# Patient Record
Sex: Female | Born: 1935 | Race: White | Hispanic: No | Marital: Married | Smoking: Former smoker
Health system: Southern US, Community
[De-identification: ages and names within clinical notes are randomized; demographics above are authoritative.]

## PROBLEM LIST (undated history)

## (undated) DIAGNOSIS — F419 Anxiety disorder, unspecified: Secondary | ICD-10-CM

## (undated) DIAGNOSIS — E039 Hypothyroidism, unspecified: Secondary | ICD-10-CM

## (undated) DIAGNOSIS — N3020 Other chronic cystitis without hematuria: Secondary | ICD-10-CM

## (undated) DIAGNOSIS — N189 Chronic kidney disease, unspecified: Secondary | ICD-10-CM

## (undated) DIAGNOSIS — M1990 Unspecified osteoarthritis, unspecified site: Secondary | ICD-10-CM

## (undated) DIAGNOSIS — D509 Iron deficiency anemia, unspecified: Secondary | ICD-10-CM

## (undated) DIAGNOSIS — M4800 Spinal stenosis, site unspecified: Secondary | ICD-10-CM

## (undated) DIAGNOSIS — I10 Essential (primary) hypertension: Secondary | ICD-10-CM

## (undated) DIAGNOSIS — K219 Gastro-esophageal reflux disease without esophagitis: Secondary | ICD-10-CM

## (undated) DIAGNOSIS — K529 Noninfective gastroenteritis and colitis, unspecified: Secondary | ICD-10-CM

## (undated) DIAGNOSIS — D519 Vitamin B12 deficiency anemia, unspecified: Secondary | ICD-10-CM

## (undated) DIAGNOSIS — D631 Anemia in chronic kidney disease: Secondary | ICD-10-CM

## (undated) DIAGNOSIS — R233 Spontaneous ecchymoses: Secondary | ICD-10-CM

## (undated) DIAGNOSIS — R238 Other skin changes: Secondary | ICD-10-CM

## (undated) DIAGNOSIS — M810 Age-related osteoporosis without current pathological fracture: Secondary | ICD-10-CM

## (undated) HISTORY — PX: CATARACT EXTRACTION W/ INTRAOCULAR LENS  IMPLANT, BILATERAL: SHX1307

## (undated) HISTORY — PX: APPENDECTOMY: SHX54

## (undated) HISTORY — PX: HAMMER TOE SURGERY: SHX385

## (undated) HISTORY — PX: VAGINAL HYSTERECTOMY: SUR661

## (undated) HISTORY — PX: BACK SURGERY: SHX140

---

## 1998-01-11 ENCOUNTER — Other Ambulatory Visit: Admission: RE | Admit: 1998-01-11 | Discharge: 1998-01-11 | Payer: Self-pay | Admitting: *Deleted

## 1998-09-12 ENCOUNTER — Other Ambulatory Visit: Admission: RE | Admit: 1998-09-12 | Discharge: 1998-09-12 | Payer: Self-pay | Admitting: Obstetrics and Gynecology

## 1998-10-29 ENCOUNTER — Ambulatory Visit (HOSPITAL_COMMUNITY): Admission: RE | Admit: 1998-10-29 | Discharge: 1998-10-29 | Payer: Self-pay | Admitting: Gastroenterology

## 2000-07-01 ENCOUNTER — Encounter: Admission: RE | Admit: 2000-07-01 | Discharge: 2000-07-01 | Payer: Self-pay | Admitting: Urology

## 2000-07-01 ENCOUNTER — Encounter: Payer: Self-pay | Admitting: Urology

## 2000-10-29 ENCOUNTER — Encounter: Payer: Self-pay | Admitting: Orthopedic Surgery

## 2000-10-29 ENCOUNTER — Ambulatory Visit (HOSPITAL_COMMUNITY): Admission: RE | Admit: 2000-10-29 | Discharge: 2000-10-29 | Payer: Self-pay | Admitting: Orthopedic Surgery

## 2000-11-19 ENCOUNTER — Encounter: Payer: Self-pay | Admitting: Orthopedic Surgery

## 2000-11-19 ENCOUNTER — Encounter: Admission: RE | Admit: 2000-11-19 | Discharge: 2000-11-19 | Payer: Self-pay | Admitting: Orthopedic Surgery

## 2000-12-03 ENCOUNTER — Encounter: Admission: RE | Admit: 2000-12-03 | Discharge: 2000-12-03 | Payer: Self-pay | Admitting: Orthopedic Surgery

## 2000-12-03 ENCOUNTER — Encounter: Payer: Self-pay | Admitting: Orthopedic Surgery

## 2000-12-16 ENCOUNTER — Encounter: Payer: Self-pay | Admitting: Orthopedic Surgery

## 2000-12-16 ENCOUNTER — Encounter: Admission: RE | Admit: 2000-12-16 | Discharge: 2000-12-16 | Payer: Self-pay | Admitting: Orthopedic Surgery

## 2003-06-18 ENCOUNTER — Encounter: Admission: RE | Admit: 2003-06-18 | Discharge: 2003-06-18 | Payer: Self-pay | Admitting: Orthopedic Surgery

## 2003-06-19 ENCOUNTER — Ambulatory Visit (HOSPITAL_COMMUNITY): Admission: RE | Admit: 2003-06-19 | Discharge: 2003-06-19 | Payer: Self-pay | Admitting: Orthopedic Surgery

## 2003-06-19 ENCOUNTER — Ambulatory Visit (HOSPITAL_BASED_OUTPATIENT_CLINIC_OR_DEPARTMENT_OTHER): Admission: RE | Admit: 2003-06-19 | Discharge: 2003-06-19 | Payer: Self-pay | Admitting: Orthopedic Surgery

## 2003-10-19 ENCOUNTER — Ambulatory Visit (HOSPITAL_COMMUNITY): Admission: RE | Admit: 2003-10-19 | Discharge: 2003-10-19 | Payer: Self-pay | Admitting: Orthopedic Surgery

## 2003-11-26 ENCOUNTER — Encounter: Admission: RE | Admit: 2003-11-26 | Discharge: 2003-11-26 | Payer: Self-pay | Admitting: Internal Medicine

## 2005-01-14 ENCOUNTER — Encounter: Admission: RE | Admit: 2005-01-14 | Discharge: 2005-01-14 | Payer: Self-pay | Admitting: Internal Medicine

## 2005-02-13 ENCOUNTER — Ambulatory Visit (HOSPITAL_COMMUNITY): Admission: RE | Admit: 2005-02-13 | Discharge: 2005-02-14 | Payer: Self-pay | Admitting: Neurosurgery

## 2005-06-22 HISTORY — PX: TOTAL KNEE ARTHROPLASTY: SHX125

## 2006-04-09 ENCOUNTER — Inpatient Hospital Stay (HOSPITAL_COMMUNITY): Admission: RE | Admit: 2006-04-09 | Discharge: 2006-04-14 | Payer: Self-pay | Admitting: Orthopedic Surgery

## 2006-05-05 ENCOUNTER — Encounter: Payer: Self-pay | Admitting: Orthopedic Surgery

## 2006-05-18 ENCOUNTER — Inpatient Hospital Stay: Payer: Self-pay | Admitting: Internal Medicine

## 2006-05-18 ENCOUNTER — Other Ambulatory Visit: Payer: Self-pay

## 2006-05-22 ENCOUNTER — Encounter: Payer: Self-pay | Admitting: Orthopedic Surgery

## 2006-06-22 ENCOUNTER — Encounter: Payer: Self-pay | Admitting: Orthopedic Surgery

## 2006-07-23 ENCOUNTER — Encounter: Payer: Self-pay | Admitting: Orthopedic Surgery

## 2007-02-08 ENCOUNTER — Emergency Department: Payer: Self-pay | Admitting: Emergency Medicine

## 2007-02-08 ENCOUNTER — Other Ambulatory Visit: Payer: Self-pay

## 2007-04-12 ENCOUNTER — Ambulatory Visit: Payer: Self-pay | Admitting: Gastroenterology

## 2008-06-22 HISTORY — PX: TOTAL HIP ARTHROPLASTY: SHX124

## 2008-08-20 ENCOUNTER — Ambulatory Visit: Payer: Self-pay | Admitting: Oncology

## 2008-08-21 ENCOUNTER — Ambulatory Visit: Payer: Self-pay | Admitting: Oncology

## 2008-09-20 ENCOUNTER — Ambulatory Visit: Payer: Self-pay | Admitting: Oncology

## 2008-11-07 ENCOUNTER — Ambulatory Visit: Payer: Self-pay | Admitting: Oncology

## 2008-11-17 ENCOUNTER — Emergency Department: Payer: Self-pay | Admitting: Unknown Physician Specialty

## 2008-11-20 ENCOUNTER — Ambulatory Visit: Payer: Self-pay | Admitting: Oncology

## 2008-12-20 ENCOUNTER — Ambulatory Visit: Payer: Self-pay | Admitting: Oncology

## 2009-01-29 ENCOUNTER — Ambulatory Visit: Payer: Self-pay | Admitting: Oncology

## 2009-02-20 ENCOUNTER — Ambulatory Visit: Payer: Self-pay | Admitting: Oncology

## 2009-03-22 ENCOUNTER — Ambulatory Visit: Payer: Self-pay | Admitting: Oncology

## 2009-04-22 ENCOUNTER — Ambulatory Visit: Payer: Self-pay | Admitting: Oncology

## 2009-04-23 ENCOUNTER — Ambulatory Visit: Payer: Self-pay | Admitting: Oncology

## 2009-05-22 ENCOUNTER — Ambulatory Visit: Payer: Self-pay | Admitting: Oncology

## 2009-06-22 ENCOUNTER — Ambulatory Visit: Payer: Self-pay | Admitting: Oncology

## 2009-07-23 ENCOUNTER — Ambulatory Visit: Payer: Self-pay | Admitting: Oncology

## 2009-07-30 ENCOUNTER — Ambulatory Visit: Payer: Self-pay | Admitting: Obstetrics and Gynecology

## 2009-08-13 ENCOUNTER — Ambulatory Visit: Payer: Self-pay | Admitting: Oncology

## 2009-08-20 ENCOUNTER — Ambulatory Visit: Payer: Self-pay | Admitting: Oncology

## 2009-09-20 ENCOUNTER — Ambulatory Visit: Payer: Self-pay | Admitting: Oncology

## 2009-09-24 ENCOUNTER — Ambulatory Visit: Payer: Self-pay | Admitting: Oncology

## 2009-10-20 ENCOUNTER — Ambulatory Visit: Payer: Self-pay | Admitting: Oncology

## 2009-11-20 ENCOUNTER — Ambulatory Visit: Payer: Self-pay | Admitting: Oncology

## 2009-12-03 ENCOUNTER — Ambulatory Visit: Payer: Self-pay | Admitting: Oncology

## 2009-12-20 ENCOUNTER — Ambulatory Visit: Payer: Self-pay | Admitting: Oncology

## 2010-01-20 ENCOUNTER — Ambulatory Visit: Payer: Self-pay | Admitting: Oncology

## 2010-02-20 ENCOUNTER — Ambulatory Visit: Payer: Self-pay | Admitting: Oncology

## 2010-03-22 ENCOUNTER — Ambulatory Visit: Payer: Self-pay | Admitting: Oncology

## 2010-04-22 ENCOUNTER — Ambulatory Visit: Payer: Self-pay | Admitting: Oncology

## 2010-05-27 ENCOUNTER — Ambulatory Visit: Payer: Self-pay | Admitting: Oncology

## 2010-06-22 ENCOUNTER — Ambulatory Visit: Payer: Self-pay | Admitting: Oncology

## 2010-07-23 ENCOUNTER — Ambulatory Visit: Payer: Self-pay | Admitting: Oncology

## 2010-08-21 ENCOUNTER — Ambulatory Visit: Payer: Self-pay | Admitting: Oncology

## 2010-09-21 ENCOUNTER — Ambulatory Visit: Payer: Self-pay | Admitting: Oncology

## 2010-10-06 ENCOUNTER — Emergency Department (HOSPITAL_COMMUNITY): Payer: Medicare Other

## 2010-10-06 ENCOUNTER — Emergency Department (HOSPITAL_COMMUNITY)
Admission: EM | Admit: 2010-10-06 | Discharge: 2010-10-06 | Disposition: A | Discharge status: Home / Self Care | Payer: Medicare Other | Attending: Emergency Medicine | Admitting: Emergency Medicine

## 2010-10-06 DIAGNOSIS — E039 Hypothyroidism, unspecified: Secondary | ICD-10-CM | POA: Insufficient documentation

## 2010-10-06 DIAGNOSIS — I10 Essential (primary) hypertension: Secondary | ICD-10-CM | POA: Insufficient documentation

## 2010-10-06 DIAGNOSIS — Z96649 Presence of unspecified artificial hip joint: Secondary | ICD-10-CM | POA: Insufficient documentation

## 2010-10-06 DIAGNOSIS — K219 Gastro-esophageal reflux disease without esophagitis: Secondary | ICD-10-CM | POA: Insufficient documentation

## 2010-10-06 DIAGNOSIS — M25559 Pain in unspecified hip: Secondary | ICD-10-CM | POA: Insufficient documentation

## 2010-10-08 ENCOUNTER — Emergency Department: Payer: Self-pay | Admitting: Emergency Medicine

## 2010-10-15 ENCOUNTER — Emergency Department (HOSPITAL_COMMUNITY): Payer: Medicare Other

## 2010-10-15 ENCOUNTER — Inpatient Hospital Stay: Admission: AD | Admit: 2010-10-15 | Payer: Self-pay | Source: Ambulatory Visit | Admitting: Neurosurgery

## 2010-10-15 ENCOUNTER — Inpatient Hospital Stay (HOSPITAL_COMMUNITY)
Admission: EM | Admit: 2010-10-15 | Discharge: 2010-10-18 | DRG: 491 | Disposition: A | Discharge status: Home / Self Care | Payer: Medicare Other | Attending: Neurosurgery | Admitting: Neurosurgery

## 2010-10-15 DIAGNOSIS — M216X9 Other acquired deformities of unspecified foot: Secondary | ICD-10-CM | POA: Diagnosis present

## 2010-10-15 DIAGNOSIS — Z96659 Presence of unspecified artificial knee joint: Secondary | ICD-10-CM

## 2010-10-15 DIAGNOSIS — N189 Chronic kidney disease, unspecified: Secondary | ICD-10-CM | POA: Diagnosis present

## 2010-10-15 DIAGNOSIS — M47817 Spondylosis without myelopathy or radiculopathy, lumbosacral region: Secondary | ICD-10-CM | POA: Diagnosis present

## 2010-10-15 DIAGNOSIS — F411 Generalized anxiety disorder: Secondary | ICD-10-CM | POA: Diagnosis present

## 2010-10-15 DIAGNOSIS — M5137 Other intervertebral disc degeneration, lumbosacral region: Secondary | ICD-10-CM | POA: Diagnosis present

## 2010-10-15 DIAGNOSIS — M51379 Other intervertebral disc degeneration, lumbosacral region without mention of lumbar back pain or lower extremity pain: Secondary | ICD-10-CM | POA: Diagnosis present

## 2010-10-15 DIAGNOSIS — E039 Hypothyroidism, unspecified: Secondary | ICD-10-CM | POA: Diagnosis present

## 2010-10-15 DIAGNOSIS — M5126 Other intervertebral disc displacement, lumbar region: Principal | ICD-10-CM | POA: Diagnosis present

## 2010-10-15 DIAGNOSIS — I129 Hypertensive chronic kidney disease with stage 1 through stage 4 chronic kidney disease, or unspecified chronic kidney disease: Secondary | ICD-10-CM | POA: Diagnosis present

## 2010-10-15 LAB — CBC
Hemoglobin: 12.5 g/dL (ref 12.0–15.0)
MCHC: 32.4 g/dL (ref 30.0–36.0)
RDW: 13.4 % (ref 11.5–15.5)

## 2010-10-15 LAB — BASIC METABOLIC PANEL
CO2: 25 mEq/L (ref 19–32)
Calcium: 9.1 mg/dL (ref 8.4–10.5)
Creatinine, Ser: 1.61 mg/dL — ABNORMAL HIGH (ref 0.4–1.2)
GFR calc Af Amer: 37 mL/min — ABNORMAL LOW (ref >60)

## 2010-10-15 LAB — DIFFERENTIAL
Basophils Absolute: 0 10*3/uL (ref 0.0–0.1)
Basophils Relative: 0 % (ref 0–1)
Monocytes Absolute: 0.9 10*3/uL (ref 0.1–1.0)
Neutro Abs: 5.6 10*3/uL (ref 1.7–7.7)

## 2010-10-15 LAB — PROTIME-INR
INR: 0.98 (ref 0.00–1.49)
Prothrombin Time: 13.2 seconds (ref 11.6–15.2)

## 2010-10-16 LAB — URINALYSIS, ROUTINE W REFLEX MICROSCOPIC
Bilirubin Urine: NEGATIVE
Glucose, UA: NEGATIVE mg/dL
Hgb urine dipstick: NEGATIVE
Ketones, ur: NEGATIVE mg/dL
pH: 5.5 (ref 5.0–8.0)

## 2010-10-16 LAB — C-REACTIVE PROTEIN: CRP: 0.1 mg/dL — ABNORMAL LOW (ref <0.6)

## 2010-10-17 ENCOUNTER — Inpatient Hospital Stay (HOSPITAL_COMMUNITY): Payer: Medicare Other

## 2010-10-17 LAB — ABO/RH: ABO/RH(D): O POS

## 2010-10-17 LAB — TYPE AND SCREEN: Antibody Screen: NEGATIVE

## 2010-10-22 NOTE — Op Note (Signed)
Victoria Rodgers            ACCOUNT NO.:  [id number]  MEDICAL RECORD NO.:  [id number]           PATIENT TYPE:  I  LOCATION:  3039                         FACILITY:  [facility]  PHYSICIAN:  Danae Orleans. Venetia Maxon, M.D.  DATE OF BIRTH:  [birth year]  DATE OF PROCEDURE:  10/17/2010 DATE OF DISCHARGE:                              OPERATIVE REPORT   PREOPERATIVE DIAGNOSES:  Left L5 radiculopathy, footdrop with spondylosis, herniated disk, degenerative disk disease at L4-L5 and L5- S1 levels.  POSTOPERATIVE DIAGNOSES:  Left L5 radiculopathy, footdrop with spondylosis, herniated disk, degenerative disk disease at L4-L5 and L5- S1 levels.  PROCEDURES: 1. Redo-decompressive lumbar laminectomy, L4-L5 left. 2. Left L5-S1 decompressive laminectomy with foraminotomy. 3. Microdissection.  SURGEON:  Danae Orleans. Venetia Maxon, MD  ASSISTANT:  Cristi Loron, MD  ANESTHESIA:  General endotracheal anesthesia.  ESTIMATED BLOOD LOSS:  Minimal.  COMPLICATIONS:  None.  DISPOSITION:  To recovery.  INDICATIONS:  Victoria Rodgers is an 77 year old woman with a left footdrop.  She had previous left L4-L5 microdiskectomy and has a foraminal and extraforaminal compression of the left L5 nerve root at the L5-S1 level with significant L5 nerve root compression at the L4-L5 level.  It was elected to perform decompression at both levels.  DESCRIPTION OF PROCEDURE:  Victoria Rodgers was brought to the operating room.  Following satisfactory and uncomplicated induction of general endotracheal anesthesia plus intravenous lines, the patient was placed in a prone position on the Wilson frame.  Soft tissue and bony prominences were padded appropriately.  Her low back was prepped and draped in usual sterile fashion.  Area of planned incision was infiltrated with local lidocaine.  Incision was then made in the midline, carried to the lumbodorsal fascia, it was incised sharply on the left side of midline.  Subperiosteal  dissection was performed exposing the L4-L5 and L5-S1 levels.  Intraoperative x-ray confirmed correct orientation.  Previous scar tissue was cleared from the L4-L5 level and a laminectomy of L5 was performed with a high-speed drill and Kerrison rongeurs to decompress the L5 nerve root from its takeoff up to the level of the L5 pedicle, also the S1 nerve root was decompressed. At the L4-L5 level, a redo-laminectomy was performed.  Careful dissection was made through scar tissue and the lateral thecal sac was exposed decompressed as was the L5 nerve root.  Significant scar tissue was removed with significant decompression of the thecal sac at L5 nerve root.  Under microscopic visualization, this decompression was completed and the L5 nerve root was decompressed widely as it extended out the neural foramen using angled Kerrison rongeurs.  It was felt that the angled curettes were used to remove additional ligamentous material that was causing significant compression of the L5 nerve root.  It was felt that the neural elements were well decompressed at this point. Hemostasis was assured.  Gelfoam soaked in thrombin.  The operative site was bathed in Depo-Medrol and fentanyl.  The self-retaining retractor was removed.  The lumbodorsal fascia was closed with 0 Vicryl sutures, subcutaneous tissues were reapproximated with 2-0 Vicryl interrupted inverted sutures, and skin edges were reapproximated with 3-0 Vicryl subcuticular stitch.  The wound was dressed with Dermabond.  The patient was extubated in the operating room and taken to the recovery in stable and satisfactory condition having tolerated the operation well.  Counts were correct at the end of the case.     Danae Orleans. Venetia Maxon, M.D.     JDS/MEDQ  D:  10/17/2010  T:  10/18/2010  Job:  [id number]  Electronically Signed by Maeola Harman M.D. on 10/22/2010 08:14:28 AM

## 2010-11-04 NOTE — Discharge Summary (Signed)
  [name], Victoria            ACCOUNT NO.:  [id number]  MEDICAL RECORD NO.:  [id number]           PATIENT TYPE:  I  LOCATION:  3039                         FACILITY:  [facility]  PHYSICIAN:  Danae Orleans. Venetia Maxon, M.D.  DATE OF BIRTH:  1928-03-02  DATE OF ADMISSION:  10/15/2010 DATE OF DISCHARGE:  10/18/2010                              DISCHARGE SUMMARY   REASON FOR ADMISSION:  Left leg pain which was intractable.  The patient presented to the office and was found to have left dorsiflexion weakness.  She had an MRI of her lumbar spine performed on an urgent basis which showed a left L5-S1 foraminal nerve root compression of the left L5 nerve root and L4-5 spondylosis and stenosis. The patient was started on Decadron with some improvement in her radicular pain.  However, she did not get complete relief of her pain and it was elected at that point to take her to surgery.  She was then taken to surgery on October 17, 2010, and underwent a left L5-S1 decompression with micro dissection and a left L4-5 redo decompression for recurrent spinal stenosis.  The patient did well with her surgery and postoperatively had relief of her leg pain and was then doing well on October 18, 2010, and was discharged home in stable and satisfactory condition having tolerated her operation and hospitalization well with relief of her leg pain and with improvement in her dorsiflexion weakness.  Her final diagnoses were same as admission.  DISCHARGE MEDICATIONS: 1. Completion of Medrol Dosepak. 2. Nexium 40 mg daily. 3. Benicar 40 mg daily. 4. Citalopram 20 mg daily. 5. Budesonide 3 mg orally 2 capsules twice daily. 6. Levothyroxine 88 mcg daily. 7. Percocet 5/325 one to two every 4 hours as needed for pain.  Instructions were for the patient to ambulate progressively today, to follow up with me in my office 3 weeks post discharge.     Danae Orleans. Venetia Maxon, M.D.     JDS/MEDQ  D:  10/28/2010  T:   10/28/2010  Job:  [id number]  Electronically Signed by Maeola Harman M.D. on 11/04/2010 07:43:59 AM

## 2010-11-20 ENCOUNTER — Ambulatory Visit: Payer: Self-pay | Admitting: Oncology

## 2010-11-21 ENCOUNTER — Ambulatory Visit: Payer: Self-pay | Admitting: Oncology

## 2011-01-22 ENCOUNTER — Ambulatory Visit: Payer: Self-pay | Admitting: Oncology

## 2011-02-21 ENCOUNTER — Ambulatory Visit: Payer: Self-pay | Admitting: Oncology

## 2011-04-23 ENCOUNTER — Ambulatory Visit: Payer: Self-pay | Admitting: Oncology

## 2011-05-23 ENCOUNTER — Ambulatory Visit: Payer: Self-pay | Admitting: Oncology

## 2011-07-09 ENCOUNTER — Ambulatory Visit: Payer: Self-pay | Admitting: Oncology

## 2011-07-09 LAB — CANCER CENTER HEMOGLOBIN: HGB: 10.1 g/dL — ABNORMAL LOW (ref 12.0–16.0)

## 2011-07-24 ENCOUNTER — Other Ambulatory Visit: Payer: Self-pay | Admitting: Neurosurgery

## 2011-07-24 ENCOUNTER — Ambulatory Visit: Payer: Self-pay | Admitting: Oncology

## 2011-07-24 DIAGNOSIS — M542 Cervicalgia: Secondary | ICD-10-CM

## 2011-07-31 ENCOUNTER — Ambulatory Visit
Admission: RE | Admit: 2011-07-31 | Discharge: 2011-07-31 | Disposition: A | Discharge status: Home / Self Care | Payer: Medicare Other | Source: Ambulatory Visit | Attending: Neurosurgery | Admitting: Neurosurgery

## 2011-07-31 DIAGNOSIS — M542 Cervicalgia: Secondary | ICD-10-CM

## 2011-08-13 ENCOUNTER — Encounter (HOSPITAL_COMMUNITY): Payer: Self-pay | Admitting: Pharmacy Technician

## 2011-08-13 ENCOUNTER — Other Ambulatory Visit: Payer: Self-pay | Admitting: Neurosurgery

## 2011-08-20 ENCOUNTER — Encounter (HOSPITAL_COMMUNITY)
Admission: RE | Admit: 2011-08-20 | Discharge: 2011-08-20 | Disposition: A | Discharge status: Home / Self Care | Payer: Medicare Other | Source: Ambulatory Visit | Attending: Neurosurgery | Admitting: Neurosurgery

## 2011-08-20 ENCOUNTER — Ambulatory Visit (HOSPITAL_COMMUNITY)
Admission: RE | Admit: 2011-08-20 | Discharge: 2011-08-20 | Disposition: A | Discharge status: Home / Self Care | Payer: Medicare Other | Source: Ambulatory Visit | Attending: Anesthesiology | Admitting: Anesthesiology

## 2011-08-20 ENCOUNTER — Encounter (HOSPITAL_COMMUNITY): Payer: Self-pay

## 2011-08-20 HISTORY — DX: Essential (primary) hypertension: I10

## 2011-08-20 HISTORY — DX: Spontaneous ecchymoses: R23.3

## 2011-08-20 HISTORY — DX: Other skin changes: R23.8

## 2011-08-20 HISTORY — DX: Hypothyroidism, unspecified: E03.9

## 2011-08-20 HISTORY — DX: Unspecified osteoarthritis, unspecified site: M19.90

## 2011-08-20 HISTORY — DX: Gastro-esophageal reflux disease without esophagitis: K21.9

## 2011-08-20 HISTORY — DX: Anxiety disorder, unspecified: F41.9

## 2011-08-20 LAB — BASIC METABOLIC PANEL
BUN: 34 mg/dL — ABNORMAL HIGH (ref 6–23)
Calcium: 9.6 mg/dL (ref 8.4–10.5)
GFR calc non Af Amer: 28 mL/min — ABNORMAL LOW (ref >90)
Glucose, Bld: 88 mg/dL (ref 70–99)
Sodium: 137 mEq/L (ref 135–145)

## 2011-08-20 LAB — SURGICAL PCR SCREEN: MRSA, PCR: NEGATIVE

## 2011-08-20 LAB — CBC
MCH: 31.3 pg (ref 26.0–34.0)
MCHC: 32.8 g/dL (ref 30.0–36.0)
Platelets: 206 10*3/uL (ref 150–400)
RBC: 3.2 MIL/uL — ABNORMAL LOW (ref 3.87–5.11)

## 2011-08-20 MED ORDER — CEFAZOLIN SODIUM-DEXTROSE 2-3 GM-% IV SOLR
2.0000 g | INTRAVENOUS | Status: AC
  Administered 2011-08-21: 2 g via INTRAVENOUS
  Filled 2011-08-20: qty 50

## 2011-08-20 NOTE — Progress Notes (Signed)
Pt doesn't have a cardiologist;medical MD Dr.James Burnett Sheng maintains HTN  Denies having an echo/stress test/heart cath

## 2011-08-20 NOTE — H&P (Signed)
Victoria Rodgers #[id number] DOB:  08/07/1927  08/12/2011:    Victoria Rodgers returns today to review her MRI of her cervical spine, which shows that at C4-5 she has disc degeneration and spondylosis, with bilateral facet degeneration and mild spinal stenosis.  At C5-6 she has disc degeneration and extensive spondylosis, prominent posterior osteophytes causing flattening of the cord with moderate spinal stenosis and moderate foraminal encroachment bilaterally. At C6-7 she has advanced spondylosis with large posterior osteophytes causing moderate spinal stenosis and cord deformity, with moderate to severe foraminal encroachment bilaterally.  There is 3 mm. of anterior slip of C7 on T1 without evidence of foraminal or nerve root compression.    Based on her examination and significance of pain, which persists as well as with significant persistent weakness, which to my review on today's examination she does have weakness principally involving her biceps, wrists and triceps.   I have advised her to proceed with surgical correction of her significant structural abnormalities including cervical kyphosis as well as cord compression, for cervical radiculopathy and myelopathy.  This would consist of anterior cervical decompression and fusion C4 through C7 levels.  She wishes to proceed with this and this has been set up for 08/21/2011.   I have recommended to the patient that she undergo anterior cervical discectomy and fusion with plating.  I went over the diagnostic studies in detail and reviewed surgical models and also discussed the exact nature of the surgical procedure, attendant risks, potential benefits and typical operative and postoperative course.  I discussed the risks of surgery which include, but are not limited to, risks of anesthesia, blood loss, infection, injury to various neck structures including the trachea, esophagus, which could cause temporary or permanent swallowing difficulties and also the  potential for perforation of the esophagus which might require operative intervention, larynx, recurrent laryngeal nerve, which could cause either temporary or permanent vocal cord paralysis resulting in either temporary or permanent voice changes, injury to cervical nerve roots, which could cause either temporary or permanent arm pain, numbness and/or weakness.  There is a small chance of injury to the spinal cord which could cause paralysis.  There is also the potential for malplacement of instrumentation, fusion failure, need for repeat surgery, degenerative disease at other levels in the neck, failure to relieve the pain, worsening of pain.  I also discussed with the patient that she will lose some neck mobility from the surgery.  It is typical to stay in the hospital overnight after this operation.  Typically she will not be able to drive for two weeks after surgery and will come back to see me two weeks following surgery with a lateral C-spine x-ray and then for monthly visits to three months after surgery.  Generally patients are out of work for four to six weeks following surgery.  She will wear a soft collar for two weeks after surgery.    She was fitted for a Vista cervical collar and is aware of the risks and benefits, and wishes to proceed.          Danae Orleans. Venetia Maxon, M.D./aft  cc: Dr. Jerl Mina    NEUROSURGICAL CONSULTATION  Victoria Rodgers DOB: September 14, 1927 #[id number] October 15, 2010    HISTORY: Victoria Rodgers is an 78 year old female who comes in for evaluation of severe left lower extremity pain greater than low back pain. Victoria Rodgers is a remote patient of Dr. Fredrich Birks who was scheduled to see Dr. Venetia Maxon today; however, he had an emergency  subdural hematoma evacuation surgery to perform. She showed up at our office and we were asked to see her because of the severity of her pain. She is here in our office and is in obvious distress. She has had a 11-day history of severe left lower  extremity pain. She is not getting any sleep. She is not able to sit without severe pain. She is lying down, tearful, and complaining of severe left lower extremity pain. She has had two surgeries in the past to her lumbar spine, in 1979 a microdiscectomy L5-S1 on the right by Dr. Vear Clock and in 2006 a left-sided L4-5 microdiscectomy by Dr. Venetia Maxon; both times recovered fully and did very well. This pain seems similar to this previous pain. She did initially start out with some left hip pain. Dr. Lequita Halt saw her and ruled out hip being the cause of the pain and then as the pain intensified and became more radicular she felt that it was coming from her back. She is concerned that she may have another herniated disc. She does not have any bowel or bladder dysfunction. No headache, convulsions, seizures, or loss of consciousness. She does have some weakness in the left foot and a little bit of numbness and tingling in the left lower extremity, L5 and S1 nerve root distributions.   PAST MEDICAL HISTORY: Positive for hypertension, colitis, hypothyroidism. Negative for heart problems, stroke, diabetes, cancer, tumors, or lung disease. Previous surgery - in 2007 a total knee replacement on the left by Dr. Lequita Halt, 04/2010 a right total hip replacement by Dr. Lequita Halt. No known drug allergies. Sulfa medications cause nausea. Her current medications include Benicar 40 mg. po qd, Nexium 40 mg. po qd, Premarin .3 mg. po qd, Levothyroxine 88 mcg. po qd, Dicyclomine one tablet four times a day prn, Citalopram 20 mg. po qd, Triam/Hydrochlorothiazide 37.5/25 po qd, Bidisomide 3 mg. two po qd.   FAMILY HISTORY: Her mother and father's family history is not pertinent.   SOCIAL HISTORY: Negative tobacco use, negative alcohol use, negative history of substance abuse. She is 5'6", 156 lbs.   REVIEW OF SYSTEMS: Review of systems x 14 is positive currently for leg pain. Negative for fever, chills, nausea, vomiting, diarrhea,  shortness of breath, or chest pain.   PHYSICAL EXAMINATION: Victoria Rodgers is a well developed, well nourished, 78 year old female in mild distress secondary to severe pain. She is lying on the exam table and had a difficult time sitting up because of the increased pain. She is tearful secondary to the pain. Normocephalic, atraumatic. The pupils are equal, round, and reactive to light and accommodation. The extraocular movements are intact. Her neck is negative for JVD and bruits. Chest is clear to auscultation. Breast and GU exam are not pertinent for present symptomatology and not performed. Heart regular rate and rhythm, normal S1 and S2. No auscultation without murmurs, rubs or gallops. Abdomen - positive bowel sounds, soft, nontender. Full range of motion of bilateral upper and lower extremities. The left lower extremity is 4-/5 extensor hallucis longus and anterior tibialis. Decreased sensation in the L5-S1 nerve root distributions. Skin is within normal limits. No new focal deficits except for left lower extremity per above.   DIAGNOSTIC STUDIES: X-rays show the lumbar spine to have severe degenerative disc disease at L4-5 and L5-S1 with degenerative scoliosis to the left, facet arthritis, and degenerative spondylitic lumbar spine.   IMPRESSION: Intractable low back pain and left lower extremity pain. Rule out herniated nucleus pulposus on the left,  with underlying spondylosis and degenerative disc disease with two previous discectomies on the right at L5-S1 in 1979 and on the left at L4-5 in 2006 by Dr. Venetia Maxon.   PLAN OF ACTION: Admit patient for pain control, STAT MRI of the lumbar spine and possible surgical intervention to decompress the nerves if she does indeed have a new herniated nucleus pulposus. She will be placed at NPO and we will place her on I.V. Morphine and I.V. Valium for pain control. Start her on her home medications and she will be at bedrest with bathroom privileges with  assistance. We will place her on sequential compression device for prophylaxis against DVT. No pharmacological use of anti-coagulants due to the possibility of upcoming spinal surgery if needed. All questions encouraged, answered, and addressed. The patient is seen today by Dr. Danielle Dess in the office and Hardin Negus, PA-C in the office. We are going to direct admit her to Dr. Venetia Maxon for definitive treatment and care.    [facility]   Hardin Negus, PA-C  Supervised by Stefani Dama, M.D.  SI:aft cc: Dr. Jerl Mina

## 2011-08-20 NOTE — Pre-Procedure Instructions (Signed)
[address]  08/20/2011   Your procedure is scheduled on:  Fri, Mar 1 @ 1020am  Report to [facility] at 0715 AM.  Call this number if you have problems the morning of surgery: [phone]   Remember:   Do not eat food:After Midnight.  May have clear liquids: up to 4 Hours before arrival.(until 3:15am)  Clear liquids include soda, tea, black coffee, apple or grape juice, broth.  Take these medicines the morning of surgery with A SIP OF WATER: Celexa,Bentyl,Nexium,and Synthroid   Do not wear jewelry, make-up or nail polish.  Do not wear lotions, powders, or perfumes. You may wear deodorant.  Do not shave 48 hours prior to surgery.  Do not bring valuables to the hospital.  Contacts, dentures or bridgework may not be worn into surgery.  Leave suitcase in the car. After surgery it may be brought to your room.  For patients admitted to the hospital, checkout time is 11:00 AM the day of discharge.   Patients discharged the day of surgery will not be allowed to drive home.  Name and phone number of your driver:   Special Instructions: CHG Shower Use Special Wash: 1/2 bottle night before surgery and 1/2 bottle morning of surgery.   Please read over the following fact sheets that you were given: Pain Booklet, Coughing and Deep Breathing, MRSA Information and Surgical Site Infection Prevention

## 2011-08-21 ENCOUNTER — Inpatient Hospital Stay (HOSPITAL_COMMUNITY): Payer: Medicare Other

## 2011-08-21 ENCOUNTER — Inpatient Hospital Stay (HOSPITAL_COMMUNITY): Payer: Medicare Other | Admitting: Anesthesiology

## 2011-08-21 ENCOUNTER — Inpatient Hospital Stay (HOSPITAL_COMMUNITY)
Admission: RE | Admit: 2011-08-21 | Discharge: 2011-08-25 | DRG: 472 | Disposition: A | Discharge status: Home / Self Care | Payer: Medicare Other | Source: Ambulatory Visit | Attending: Neurosurgery | Admitting: Neurosurgery

## 2011-08-21 ENCOUNTER — Encounter (HOSPITAL_COMMUNITY): Payer: Self-pay | Admitting: *Deleted

## 2011-08-21 ENCOUNTER — Encounter (HOSPITAL_COMMUNITY)
Admission: RE | Disposition: A | Discharge status: Home / Self Care | Payer: Self-pay | Source: Ambulatory Visit | Attending: Neurosurgery

## 2011-08-21 ENCOUNTER — Encounter (HOSPITAL_COMMUNITY): Payer: Self-pay | Admitting: Anesthesiology

## 2011-08-21 DIAGNOSIS — Z96659 Presence of unspecified artificial knee joint: Secondary | ICD-10-CM

## 2011-08-21 DIAGNOSIS — M47812 Spondylosis without myelopathy or radiculopathy, cervical region: Secondary | ICD-10-CM | POA: Diagnosis present

## 2011-08-21 DIAGNOSIS — E871 Hypo-osmolality and hyponatremia: Secondary | ICD-10-CM | POA: Diagnosis present

## 2011-08-21 DIAGNOSIS — M5030 Other cervical disc degeneration, unspecified cervical region: Principal | ICD-10-CM | POA: Diagnosis present

## 2011-08-21 DIAGNOSIS — I10 Essential (primary) hypertension: Secondary | ICD-10-CM | POA: Diagnosis present

## 2011-08-21 DIAGNOSIS — E039 Hypothyroidism, unspecified: Secondary | ICD-10-CM | POA: Diagnosis present

## 2011-08-21 DIAGNOSIS — Z01812 Encounter for preprocedural laboratory examination: Secondary | ICD-10-CM

## 2011-08-21 HISTORY — PX: ANTERIOR CERVICAL DECOMP/DISCECTOMY FUSION: SHX1161

## 2011-08-21 SURGERY — ANTERIOR CERVICAL DECOMPRESSION/DISCECTOMY FUSION 3 LEVELS
Anesthesia: General | Wound class: Clean

## 2011-08-21 MED ORDER — SODIUM CHLORIDE 0.9 % IR SOLN
Status: DC | PRN
  Administered 2011-08-21: 11:00:00

## 2011-08-21 MED ORDER — BACITRACIN 50000 UNITS IM SOLR
INTRAMUSCULAR | Status: AC
  Filled 2011-08-21: qty 1

## 2011-08-21 MED ORDER — MEPERIDINE HCL 25 MG/ML IJ SOLN: 6.2500 mg | INTRAMUSCULAR | Status: DC | PRN

## 2011-08-21 MED ORDER — IRBESARTAN 75 MG PO TABS
75.0000 mg | ORAL_TABLET | Freq: Every day | ORAL | Status: DC
  Administered 2011-08-21 – 2011-08-25 (×5): 75 mg via ORAL
  Filled 2011-08-21 (×6): qty 1

## 2011-08-21 MED ORDER — LIDOCAINE HCL (CARDIAC) 20 MG/ML IV SOLN
INTRAVENOUS | Status: DC | PRN
  Administered 2011-08-21: 40 mg via INTRAVENOUS

## 2011-08-21 MED ORDER — SODIUM CHLORIDE 0.9 % IJ SOLN: 3.0000 mL | INTRAMUSCULAR | Status: DC | PRN

## 2011-08-21 MED ORDER — LIDOCAINE-EPINEPHRINE 1 %-1:100000 IJ SOLN
INTRAMUSCULAR | Status: DC | PRN
  Administered 2011-08-21: 1.5 mL

## 2011-08-21 MED ORDER — TRIAMTERENE-HCTZ 37.5-25 MG PO TABS
1.0000 | ORAL_TABLET | Freq: Every day | ORAL | Status: DC
  Administered 2011-08-21 – 2011-08-24 (×4): 1 via ORAL
  Filled 2011-08-21 (×5): qty 1

## 2011-08-21 MED ORDER — HYDROMORPHONE HCL PF 1 MG/ML IJ SOLN
0.2500 mg | INTRAMUSCULAR | Status: DC | PRN
  Administered 2011-08-21 (×2): 0.5 mg via INTRAVENOUS

## 2011-08-21 MED ORDER — ESTROGENS CONJUGATED 0.3 MG PO TABS
0.3000 mg | ORAL_TABLET | Freq: Every day | ORAL | Status: DC
  Administered 2011-08-21 – 2011-08-25 (×5): 0.3 mg via ORAL
  Filled 2011-08-21 (×6): qty 1

## 2011-08-21 MED ORDER — PHENOL 1.4 % MT LIQD: 1.0000 | OROMUCOSAL | Status: DC | PRN

## 2011-08-21 MED ORDER — BUDESONIDE 3 MG PO CP24
3.0000 mg | ORAL_CAPSULE | Freq: Every morning | ORAL | Status: DC
  Administered 2011-08-22 – 2011-08-25 (×4): 3 mg via ORAL
  Filled 2011-08-21 (×5): qty 1

## 2011-08-21 MED ORDER — LEVOTHYROXINE SODIUM 88 MCG PO TABS
88.0000 ug | ORAL_TABLET | Freq: Every day | ORAL | Status: DC
  Administered 2011-08-21 – 2011-08-25 (×5): 88 ug via ORAL
  Filled 2011-08-21 (×7): qty 1

## 2011-08-21 MED ORDER — DIAZEPAM 5 MG/ML IJ SOLN
INTRAMUSCULAR | Status: AC
  Administered 2011-08-21: 2.5 mg
  Filled 2011-08-21: qty 2

## 2011-08-21 MED ORDER — HYDROMORPHONE HCL PF 1 MG/ML IJ SOLN
INTRAMUSCULAR | Status: AC
  Filled 2011-08-21: qty 1

## 2011-08-21 MED ORDER — LEVOTHYROXINE SODIUM 88 MCG PO TABS
88.0000 ug | ORAL_TABLET | Freq: Every day | ORAL | Status: DC
  Filled 2011-08-21 (×2): qty 1

## 2011-08-21 MED ORDER — ONDANSETRON HCL 4 MG/2ML IJ SOLN
INTRAMUSCULAR | Status: DC | PRN
  Administered 2011-08-21: 4 mg via INTRAVENOUS

## 2011-08-21 MED ORDER — THROMBIN 20000 UNITS EX KIT
PACK | OROMUCOSAL | Status: DC | PRN
  Administered 2011-08-21: 11:00:00 via TOPICAL

## 2011-08-21 MED ORDER — SODIUM CHLORIDE 0.9 % IV SOLN: 250.0000 mL | INTRAVENOUS | Status: DC

## 2011-08-21 MED ORDER — PANTOPRAZOLE SODIUM 40 MG PO TBEC
40.0000 mg | DELAYED_RELEASE_TABLET | Freq: Every day | ORAL | Status: DC
  Administered 2011-08-21 – 2011-08-25 (×4): 40 mg via ORAL
  Filled 2011-08-21 (×4): qty 1

## 2011-08-21 MED ORDER — HEMOSTATIC AGENTS (NO CHARGE) OPTIME
TOPICAL | Status: DC | PRN
  Administered 2011-08-21: 1 via TOPICAL

## 2011-08-21 MED ORDER — KCL IN DEXTROSE-NACL 20-5-0.45 MEQ/L-%-% IV SOLN
INTRAVENOUS | Status: DC
  Administered 2011-08-21: 21:00:00 via INTRAVENOUS
  Filled 2011-08-21 (×4): qty 1000

## 2011-08-21 MED ORDER — SODIUM CHLORIDE 0.9 % IJ SOLN: 3.0000 mL | Freq: Two times a day (BID) | INTRAMUSCULAR | Status: DC

## 2011-08-21 MED ORDER — MORPHINE SULFATE 2 MG/ML IJ SOLN: 0.0500 mg/kg | INTRAMUSCULAR | Status: DC | PRN

## 2011-08-21 MED ORDER — IRBESARTAN 75 MG PO TABS: 75.0000 mg | ORAL_TABLET | Freq: Every day | ORAL | Status: DC

## 2011-08-21 MED ORDER — OXYCODONE-ACETAMINOPHEN 5-325 MG PO TABS
1.0000 | ORAL_TABLET | ORAL | Status: DC | PRN
  Administered 2011-08-21 (×2): 1 via ORAL
  Filled 2011-08-21: qty 2

## 2011-08-21 MED ORDER — LACTATED RINGERS IV SOLN
INTRAVENOUS | Status: DC | PRN
  Administered 2011-08-21 (×2): via INTRAVENOUS

## 2011-08-21 MED ORDER — LIDOCAINE HCL 4 % MT SOLN
OROMUCOSAL | Status: DC | PRN
  Administered 2011-08-21: 4 mL via TOPICAL

## 2011-08-21 MED ORDER — SUFENTANIL CITRATE 50 MCG/ML IV SOLN
INTRAVENOUS | Status: DC | PRN
  Administered 2011-08-21: 10 ug via INTRAVENOUS
  Administered 2011-08-21: 30 ug via INTRAVENOUS
  Administered 2011-08-21 (×2): 10 ug via INTRAVENOUS

## 2011-08-21 MED ORDER — MENTHOL 3 MG MT LOZG
1.0000 | LOZENGE | OROMUCOSAL | Status: DC | PRN
  Administered 2011-08-21: 3 mg via ORAL

## 2011-08-21 MED ORDER — ONDANSETRON HCL 4 MG/2ML IJ SOLN: 4.0000 mg | Freq: Once | INTRAMUSCULAR | Status: DC | PRN

## 2011-08-21 MED ORDER — HYDROMORPHONE HCL PF 1 MG/ML IJ SOLN: 0.2500 mg | INTRAMUSCULAR | Status: DC | PRN

## 2011-08-21 MED ORDER — DEXAMETHASONE SODIUM PHOSPHATE 10 MG/ML IJ SOLN
INTRAMUSCULAR | Status: DC | PRN
  Administered 2011-08-21: 10 mg via INTRAVENOUS

## 2011-08-21 MED ORDER — ROCURONIUM BROMIDE 100 MG/10ML IV SOLN
INTRAVENOUS | Status: DC | PRN
  Administered 2011-08-21: 50 mg via INTRAVENOUS
  Administered 2011-08-21: 5 mg via INTRAVENOUS
  Administered 2011-08-21: 10 mg via INTRAVENOUS

## 2011-08-21 MED ORDER — DIAZEPAM 5 MG/ML IJ SOLN: 2.5000 mg | Freq: Once | INTRAMUSCULAR | Status: DC

## 2011-08-21 MED ORDER — PROPOFOL 10 MG/ML IV BOLUS
INTRAVENOUS | Status: DC | PRN
  Administered 2011-08-21: 110 mg via INTRAVENOUS

## 2011-08-21 MED ORDER — 0.9 % SODIUM CHLORIDE (POUR BTL) OPTIME
TOPICAL | Status: DC | PRN
  Administered 2011-08-21: 1000 mL

## 2011-08-21 MED ORDER — DIAZEPAM 5 MG PO TABS
5.0000 mg | ORAL_TABLET | Freq: Four times a day (QID) | ORAL | Status: DC | PRN
  Administered 2011-08-22 – 2011-08-24 (×6): 5 mg via ORAL
  Filled 2011-08-21 (×6): qty 1

## 2011-08-21 MED ORDER — KCL IN DEXTROSE-NACL 20-5-0.45 MEQ/L-%-% IV SOLN
INTRAVENOUS | Status: AC
  Filled 2011-08-21: qty 1000

## 2011-08-21 MED ORDER — ACETAMINOPHEN 650 MG RE SUPP: 650.0000 mg | RECTAL | Status: DC | PRN

## 2011-08-21 MED ORDER — SODIUM CHLORIDE 0.9 % IV SOLN
INTRAVENOUS | Status: AC
  Filled 2011-08-21: qty 500

## 2011-08-21 MED ORDER — ONDANSETRON HCL 4 MG/2ML IJ SOLN
4.0000 mg | INTRAMUSCULAR | Status: DC | PRN
  Administered 2011-08-21: 4 mg via INTRAVENOUS
  Filled 2011-08-21: qty 2

## 2011-08-21 MED ORDER — CITALOPRAM HYDROBROMIDE 20 MG PO TABS
20.0000 mg | ORAL_TABLET | Freq: Every day | ORAL | Status: DC
  Administered 2011-08-21 – 2011-08-24 (×4): 20 mg via ORAL
  Filled 2011-08-21 (×5): qty 1

## 2011-08-21 MED ORDER — ACETAMINOPHEN 325 MG PO TABS
650.0000 mg | ORAL_TABLET | ORAL | Status: DC | PRN
  Administered 2011-08-23 – 2011-08-25 (×3): 650 mg via ORAL
  Filled 2011-08-21 (×3): qty 2

## 2011-08-21 MED ORDER — DICYCLOMINE HCL 10 MG PO CAPS
10.0000 mg | ORAL_CAPSULE | Freq: Every day | ORAL | Status: DC
  Administered 2011-08-21 – 2011-08-25 (×5): 10 mg via ORAL
  Filled 2011-08-21 (×6): qty 1

## 2011-08-21 MED ORDER — CEFAZOLIN SODIUM 1-5 GM-% IV SOLN
1.0000 g | Freq: Three times a day (TID) | INTRAVENOUS | Status: AC
  Administered 2011-08-21: 1 g via INTRAVENOUS
  Filled 2011-08-21 (×3): qty 50

## 2011-08-21 MED ORDER — HYDROCODONE-ACETAMINOPHEN 5-325 MG PO TABS
1.0000 | ORAL_TABLET | ORAL | Status: DC | PRN
  Administered 2011-08-21 – 2011-08-22 (×5): 2 via ORAL
  Administered 2011-08-23: 1 via ORAL
  Filled 2011-08-21 (×3): qty 2
  Filled 2011-08-21: qty 1
  Filled 2011-08-21 (×3): qty 2

## 2011-08-21 MED ORDER — BUPIVACAINE HCL (PF) 0.5 % IJ SOLN
INTRAMUSCULAR | Status: DC | PRN
  Administered 2011-08-21: 1.5 mL

## 2011-08-21 SURGICAL SUPPLY — 85 items
ADH SKN CLS APL DERMABOND .7 (GAUZE/BANDAGES/DRESSINGS) ×1
APL SKNCLS STERI-STRIP NONHPOA (GAUZE/BANDAGES/DRESSINGS)
BAG DECANTER FOR FLEXI CONT (MISCELLANEOUS) ×2 IMPLANT
BANDAGE GAUZE ELAST BULKY 4 IN (GAUZE/BANDAGES/DRESSINGS) ×2 IMPLANT
BENZOIN TINCTURE PRP APPL 2/3 (GAUZE/BANDAGES/DRESSINGS) IMPLANT
BIT DRILL 2.3 12 FIXED (INSTRUMENTS) IMPLANT
BIT DRILL NEURO 2X3.1 SFT TUCH (MISCELLANEOUS) ×1 IMPLANT
BLADE ULTRA TIP 2M (BLADE) IMPLANT
BLOCK PROFUSE SZ 6 (Bone Implant) ×1 IMPLANT
BUR BARREL STRAIGHT FLUTE 4.0 (BURR) ×2 IMPLANT
CAGE CERVICAL 6 (Cage) ×2 IMPLANT
CAGE CERVICAL 7 (Cage) ×2 IMPLANT
CAGE CERVICAL 8 (Cage) ×2 IMPLANT
CANISTER SUCTION 2500CC (MISCELLANEOUS) ×2 IMPLANT
CLOTH BEACON ORANGE TIMEOUT ST (SAFETY) ×2 IMPLANT
CONT SPEC 4OZ CLIKSEAL STRL BL (MISCELLANEOUS) ×2 IMPLANT
COVER MAYO STAND STRL (DRAPES) ×2 IMPLANT
DERMABOND ADVANCED (GAUZE/BANDAGES/DRESSINGS) ×1
DERMABOND ADVANCED .7 DNX12 (GAUZE/BANDAGES/DRESSINGS) ×1 IMPLANT
DRAPE LAPAROTOMY 100X72 PEDS (DRAPES) ×2 IMPLANT
DRAPE MICROSCOPE LEICA (MISCELLANEOUS) ×2 IMPLANT
DRAPE POUCH INSTRU U-SHP 10X18 (DRAPES) ×2 IMPLANT
DRAPE PROXIMA HALF (DRAPES) IMPLANT
DRESSING TELFA 8X3 (GAUZE/BANDAGES/DRESSINGS) IMPLANT
DRILL 12MM (INSTRUMENTS) ×2
DRILL NEURO 2X3.1 SOFT TOUCH (MISCELLANEOUS) ×2
DURAPREP 6ML APPLICATOR 50/CS (WOUND CARE) ×2 IMPLANT
ELECT COATED BLADE 2.86 ST (ELECTRODE) ×2 IMPLANT
ELECT REM PT RETURN 9FT ADLT (ELECTROSURGICAL) ×2
ELECTRODE REM PT RTRN 9FT ADLT (ELECTROSURGICAL) ×1 IMPLANT
GAUZE SPONGE 4X4 16PLY XRAY LF (GAUZE/BANDAGES/DRESSINGS) IMPLANT
GLOVE BIO SURGEON STRL SZ8 (GLOVE) ×2 IMPLANT
GLOVE BIOGEL PI IND STRL 6.5 (GLOVE) IMPLANT
GLOVE BIOGEL PI IND STRL 8 (GLOVE) ×1 IMPLANT
GLOVE BIOGEL PI IND STRL 8.5 (GLOVE) ×1 IMPLANT
GLOVE BIOGEL PI INDICATOR 6.5 (GLOVE) ×1
GLOVE BIOGEL PI INDICATOR 8 (GLOVE) ×1
GLOVE BIOGEL PI INDICATOR 8.5 (GLOVE) ×1
GLOVE ECLIPSE 7.0 STRL STRAW (GLOVE) ×1 IMPLANT
GLOVE ECLIPSE 8.0 STRL XLNG CF (GLOVE) ×2 IMPLANT
GLOVE EXAM NITRILE LRG STRL (GLOVE) IMPLANT
GLOVE EXAM NITRILE MD LF STRL (GLOVE) ×1 IMPLANT
GLOVE EXAM NITRILE XL STR (GLOVE) IMPLANT
GLOVE EXAM NITRILE XS STR PU (GLOVE) IMPLANT
GLOVE INDICATOR 7.0 STRL GRN (GLOVE) ×2 IMPLANT
GOWN BRE IMP SLV AUR LG STRL (GOWN DISPOSABLE) IMPLANT
GOWN BRE IMP SLV AUR XL STRL (GOWN DISPOSABLE) ×3 IMPLANT
GOWN STRL REIN 2XL LVL4 (GOWN DISPOSABLE) ×3 IMPLANT
HEAD HALTER (SOFTGOODS) ×2 IMPLANT
KIT BASIN OR (CUSTOM PROCEDURE TRAY) ×2 IMPLANT
KIT ROOM TURNOVER OR (KITS) ×2 IMPLANT
NDL HYPO 25X1 1.5 SAFETY (NEEDLE) ×1 IMPLANT
NDL SPNL 18GX3.5 QUINCKE PK (NEEDLE) IMPLANT
NDL SPNL 22GX3.5 QUINCKE BK (NEEDLE) ×2 IMPLANT
NEEDLE HYPO 25X1 1.5 SAFETY (NEEDLE) ×2 IMPLANT
NEEDLE SPNL 18GX3.5 QUINCKE PK (NEEDLE) IMPLANT
NEEDLE SPNL 22GX3.5 QUINCKE BK (NEEDLE) ×4 IMPLANT
NS IRRIG 1000ML POUR BTL (IV SOLUTION) ×2 IMPLANT
PACK LAMINECTOMY NEURO (CUSTOM PROCEDURE TRAY) ×2 IMPLANT
PAD ARMBOARD 7.5X6 YLW CONV (MISCELLANEOUS) ×6 IMPLANT
PIN DISTRACTION 14MM (PIN) ×4 IMPLANT
PLATE 51MM (Plate) ×2 IMPLANT
PLATE 51X3 LVL NS SPNE CVD (Plate) IMPLANT
PROFUSE BONE SIZE 2 (Bone Implant) ×1 IMPLANT
RUBBERBAND STERILE (MISCELLANEOUS) ×4 IMPLANT
SCREW 12MM (Screw) ×6 IMPLANT
SPACER SPNL 7D LRG 16X14X6XNS (Cage) IMPLANT
SPACER SPNL 7D LRG 16X14X7XNS (Cage) IMPLANT
SPACER SPNL 7D LRG 16X14X8XNS (Cage) IMPLANT
SPCR SPNL 7D LRG 16X14X6XNS (Cage) ×1 IMPLANT
SPCR SPNL 7D LRG 16X14X7XNS (Cage) ×1 IMPLANT
SPCR SPNL 7D LRG 16X14X8XNS (Cage) ×1 IMPLANT
SPONGE GAUZE 4X4 12PLY (GAUZE/BANDAGES/DRESSINGS) IMPLANT
SPONGE INTESTINAL PEANUT (DISPOSABLE) ×2 IMPLANT
SPONGE SURGIFOAM ABS GEL 100 (HEMOSTASIS) ×2 IMPLANT
STAPLER SKIN PROX WIDE 3.9 (STAPLE) IMPLANT
STRIP CLOSURE SKIN 1/2X4 (GAUZE/BANDAGES/DRESSINGS) IMPLANT
SUT VIC AB 3-0 SH 8-18 (SUTURE) ×3 IMPLANT
SYR 20ML ECCENTRIC (SYRINGE) ×2 IMPLANT
SYR 5ML LL (SYRINGE) ×1 IMPLANT
TOWEL OR 17X24 6PK STRL BLUE (TOWEL DISPOSABLE) ×2 IMPLANT
TOWEL OR 17X26 10 PK STRL BLUE (TOWEL DISPOSABLE) ×2 IMPLANT
TRAP SPECIMEN MUCOUS 40CC (MISCELLANEOUS) IMPLANT
TRAY FOLEY CATH 14FRSI W/METER (CATHETERS) ×1 IMPLANT
WATER STERILE IRR 1000ML POUR (IV SOLUTION) ×2 IMPLANT

## 2011-08-21 NOTE — Interval H&P Note (Signed)
History and Physical Interval Note:  08/21/2011 7:34 AM  Victoria Rodgers  has presented today for surgery, with the diagnosis of kyphosis cervical spondylosis cervical degenerative disc disease cervical radiculopathy  The various methods of treatment have been discussed with the patient and family. After consideration of risks, benefits and other options for treatment, the patient has consented to  Procedure(s) (LRB): ANTERIOR CERVICAL DECOMPRESSION/DISCECTOMY FUSION 3 LEVELS (N/A) as a surgical intervention .  The patients' history has been reviewed, patient examined, no change in status, stable for surgery.  I have reviewed the patients' chart and labs.  Questions were answered to the patient's satisfaction.     [name]  Date of Initial H&P: 08/12/2011  History reviewed, patient examined, no change in status, stable for surgery.

## 2011-08-21 NOTE — Op Note (Signed)
08/21/2011  1:33 PM  PATIENT:  Victoria Rodgers  78 y.o. female  PRE-OPERATIVE DIAGNOSIS:  kyphosis cervical spondylosis cervical degenerative disc disease cervical radiculopathy C4/5, C5/6, C6/7  POST-OPERATIVE DIAGNOSIS:  kyphosis cervical spondylosis cervical degenerative disc disease cervical radiculopathy C4/5, C5/6, C6/7  PROCEDURE:  Procedure(s) (LRB): ANTERIOR CERVICAL DECOMPRESSION/DISCECTOMY FUSION 3 LEVELS PEEK cages, autograft, allograft, plate(N/A)  SURGEON:  Surgeon(s) and Role:    * Dorian Heckle, MD - Primary  PHYSICIAN ASSISTANTFranky Macho, MD  ASSISTANTS: Poteat, RN   ANESTHESIA:   general  EBL:  Total I/O In: 1200 [I.V.:1200] Out: 100 [Blood:100]  BLOOD ADMINISTERED:none  DRAINS: none   LOCAL MEDICATIONS USED:  LIDOCAINE   SPECIMEN:  No Specimen  DISPOSITION OF SPECIMEN:  N/A  COUNTS:  YES  TOURNIQUET:  * No tourniquets in log *  DICTATION: Patient was brought to operating room and following the smooth and uncomplicated induction of general endotracheal anesthesia her head was placed on a horseshoe head holder she was placed in 5 pounds of Holter traction and her anterior neck was prepped and draped in usual sterile fashion. An incision was made on the left side of midline after infiltrating the skin and subcutaneous tissues with local lidocaine. The platysmal layer was incised and subplatysmal dissection was performed exposing the anterior border sternocleidomastoid muscle. Using blunt dissection the carotid sheath was kept lateral and trachea and esophagus kept medial exposing the anterior cervical spine. A bent spinal needle was placed it was felt to be the C4-5 and C5-6 levels and this was confirmed on intraoperative x-ray. Longus coli muscles were taken down from the anterior cervical spine using electrocautery and key elevator and self-retaining retractor was placed. The interspace at C4/5 and C5/6 were incised and a thorough discectomy was performed.  Distraction pins were placed. Uncinate spurs and central spondylitic ridges were drilled down with a high-speed drill. The spinal cord dura and both C5 nerve roots were widely decompressed. Hemostasis was assured. After trial sizing an 6 mm PEEK interbody cage was selected and packed with profuse block soaked in autologous blood and local autograft. The graft was tamped into position and countersunk appropriately. The C5-6 level was incised and a thorough discectomy was performed. Distraction pins were placed. Uncinate spurs and central spondylitic ridges were drilled down with a high-speed drill. The spinal cord dura and both C6 nerve roots were widely decompressed. Hemostasis was assured. After trial sizing an 7 mm PEEK interbody cage was selected and packed with profuse block and autograft. The graft was tamped into position and countersunk appropriately.The retractor was moved and the interspace at C6/7 was incised and a thorough discectomy was performed. Distraction pins were placed. Uncinate spurs and central spondylitic ridges were drilled down with a high-speed drill. The spinal cord dura and both C7 nerve roots were widely decompressed. Hemostasis was assured. After trial sizing an 8 mm PEEK interbody cage was selected and packed with profuse block and autograft. The graft was tamped into position and countersunk appropriately. Distraction weight was removed. A 51 mm trestle luxe anterior cervical plate was affixed to the cervical spine with 12 mm variable-angle screws 2 at C4, 2 at C5, 2 at C6,  and 2 at C7. All screws were well-positioned and locking mechanisms were engaged. Soft tissues were inspected and found to be in good repair. The wound was irrigated. A final x-ray was obtained with good visualization at C4 to C6 with two of the interbody grafts well visualized. The platysma layer was  closed with 3-0 Vicryl stitches and the skin was reapproximated with 3-0 Vicryl subcuticular stitches. The wound  was dressed with Dermabond. Counts were correct at the end of the case. Patient was extubated and taken to recovery in stable and satisfactory condition.    PLAN OF CARE: Admit to inpatient   PATIENT DISPOSITION:  PACU - hemodynamically stable.   Delay start of Pharmacological VTE agent (>24hrs) due to surgical blood loss or risk of bleeding: yes

## 2011-08-21 NOTE — Anesthesia Postprocedure Evaluation (Signed)
Anesthesia Post Note  Patient: Victoria Rodgers  Procedure(s) Performed: Procedure(s) (LRB): ANTERIOR CERVICAL DECOMPRESSION/DISCECTOMY FUSION 3 LEVELS (N/A)  Anesthesia type: general  Patient location: PACU  Post pain: Pain level controlled  Post assessment: Patient's Cardiovascular Status Stable  Last Vitals:  Filed Vitals:   08/21/11 1530  BP:   Pulse: 90  Temp:   Resp: 10    Post vital signs: Reviewed and stable  Level of consciousness: sedated  Complications: No apparent anesthesia complications

## 2011-08-21 NOTE — Transfer of Care (Signed)
Immediate Anesthesia Transfer of Care Note  Patient: Victoria Rodgers  Procedure(s) Performed: Procedure(s) (LRB): ANTERIOR CERVICAL DECOMPRESSION/DISCECTOMY FUSION 3 LEVELS (N/A)  Patient Location: PACU  Anesthesia Type: General  Level of Consciousness: awake and sedated  Airway & Oxygen Therapy: Patient Spontanous Breathing and Patient connected to nasal cannula oxygen  Post-op Assessment: Report given to PACU RN and Post -op Vital signs reviewed and stable  Post vital signs: Reviewed and stable  Complications: No apparent anesthesia complications

## 2011-08-21 NOTE — Anesthesia Preprocedure Evaluation (Signed)
Anesthesia Evaluation  Patient identified by MRN, date of birth, ID band Patient awake    Reviewed: Allergy & Precautions, H&P , NPO status , Patient's Chart, lab work & pertinent test results  Airway Mallampati: II TM Distance: >3 FB Neck ROM: Full    Dental   Pulmonary          Cardiovascular hypertension, Pt. on medications     Neuro/Psych    GI/Hepatic GERD-  Controlled and Medicated,  Endo/Other    Renal/GU Mild CRF baseline Cr 1.61     Musculoskeletal   Abdominal   Peds  Hematology   Anesthesia Other Findings   Reproductive/Obstetrics                           Anesthesia Physical Anesthesia Plan  ASA: II  Anesthesia Plan: General   Post-op Pain Management:    Induction: Intravenous  Airway Management Planned: Oral ETT  Additional Equipment:   Intra-op Plan:   Post-operative Plan: Extubation in OR  Informed Consent: I have reviewed the patients History and Physical, chart, labs and discussed the procedure including the risks, benefits and alternatives for the proposed anesthesia with the patient or authorized representative who has indicated his/her understanding and acceptance.     Plan Discussed with: CRNA and Surgeon  Anesthesia Plan Comments:         Anesthesia Quick Evaluation

## 2011-08-21 NOTE — Anesthesia Postprocedure Evaluation (Signed)
Anesthesia Post Note  Patient: Victoria Rodgers  Procedure(s) Performed: Procedure(s) (LRB): ANTERIOR CERVICAL DECOMPRESSION/DISCECTOMY FUSION 3 LEVELS (N/A)  Anesthesia type: general  Patient location: PACU  Post pain: Pain level controlled  Post assessment: Patient's Cardiovascular Status Stable  Last Vitals:  Filed Vitals:   08/21/11 0723  BP: 118/71  Pulse: 67  Temp: 36.7 C  Resp: 18    Post vital signs: Reviewed and stable  Level of consciousness: sedated  Complications: No apparent anesthesia complications

## 2011-08-22 MED ORDER — ONDANSETRON 4 MG PO TBDP
ORAL_TABLET | ORAL | Status: AC
  Filled 2011-08-22: qty 1

## 2011-08-22 MED ORDER — ONDANSETRON HCL 4 MG PO TABS
4.0000 mg | ORAL_TABLET | ORAL | Status: DC | PRN
  Administered 2011-08-22 – 2011-08-23 (×4): 4 mg via ORAL
  Filled 2011-08-22 (×5): qty 1

## 2011-08-22 NOTE — Progress Notes (Signed)
Occupational Therapy Evaluation Patient Details Name: Victoria Rodgers MRN: [id number] DOB: [birth year] Today's Date: 08/22/2011  Problem List: There is no problem list on file for this patient.   Past Medical History:  Past Medical History  Diagnosis Date  . Arthritis   . Neck pain     stenosis/buldging disc  . Bruises easily   . GERD (gastroesophageal reflux disease)     takes Nexium daily  . Colitis     takes Bentyly daily  . Cystitis     occasionally -sees Dr.Fennigan  . Anemia     sees Dr.Flannigan and gets Aranesp as needed;f/u every 3months   . Anxiety     takes Celexa daily  . Pneumonia     hx of at least 5-65yrs ago  . Hypertension     takes Benicar and Maxzide daily  . Hypothyroidism     takes Synthroid daily  . Blood transfusion     last time about 2 1/2 yrs ago   Past Surgical History:  Past Surgical History  Procedure Date  . Appendectomy   . Cesarean section   . Abdominal hysterectomy   . Total knee arthroplasty     left knee  . Total hip arthroplasty     right hip  . Back surgery     x 2  . Colonoscopy   . Cataracts      bilateral    OT Assessment/Plan/Recommendation OT Assessment Clinical Impression Statement: Pt s/p anteiror cervical decompression/discectomy and fusion 3 levels thus affecting PLOF.  Will benefit from acute OT to address below problem list in prep for d/c home with family. OT Recommendation/Assessment: Patient will need skilled OT in the acute care venue OT Problem List: Decreased activity tolerance;Decreased strength;Decreased knowledge of precautions OT Therapy Diagnosis : Generalized weakness OT Plan OT Frequency: Min 2X/week OT Treatment/Interventions: Self-care/ADL training;Therapeutic activities;Patient/family education OT Recommendation Follow Up Recommendations: Supervision - Intermittent Equipment Recommended: None recommended by OT Individuals Consulted Consulted and Agree with Results and Recommendations:  Patient OT Goals Acute Rehab OT Goals OT Goal Formulation: With patient Time For Goal Achievement: 7 days ADL Goals Pt Will Perform Grooming: with modified independence;Standing at sink ADL Goal: Grooming - Progress: Goal set today Pt Will Transfer to Toilet: with modified independence;Regular height toilet;3-in-1 ADL Goal: Toilet Transfer - Progress: Goal set today Miscellaneous OT Goals Miscellaneous OT Goal #1: Pt will perform bed mobility with mod I with HOB flat in prep for EOB ADLs. OT Goal: Miscellaneous Goal #1 - Progress: Goal set today  OT Evaluation Precautions/Restrictions  Precautions Precautions: Other (comment);Fall (cervical) Precaution Comments: Reviewed cervical precautions (no flexion/extension/side bending or rotation).  Required Braces or Orthoses: Yes Cervical Brace: Hard collar;Other (comment) (at all times) Restrictions Weight Bearing Restrictions: No Prior Functioning Home Living Lives With: Spouse Receives Help From: Family (daughter) Type of Home: House ([facility]) Home Layout: One level Bathroom Shower/Tub: Psychologist, counselling;Other (comment) (level entry ) Bathroom Toilet: Handicapped height Bathroom Accessibility: Yes How Accessible: Accessible via wheelchair;Accessible via walker Home Adaptive Equipment: Shower chair with back;Bedside commode/3-in-1;Straight cane;Walker - rolling;Grab bars around toilet;Grab bars in shower Prior Function Level of Independence: Independent with basic ADLs Driving: Yes Leisure: Hobbies-yes (Comment) (gardening) ADL ADL Grooming: Performed;Wash/dry hands;Supervision/safety Grooming Details (indicate cue type and reason): Instructed on safe techniques to maintain cervical precautions while grooming at sink. Where Assessed - Grooming: Standing at sink Lower Body Bathing: Simulated;Modified independent Where Assessed - Lower Body Bathing: Sit to stand from bed Lower Body  Dressing:  Performed;Modified independent (pt able to don/doff bil. socks with legs crossed) Where Assessed - Lower Body Dressing: Sitting, bed Toilet Transfer: Performed;Other (comment) (min guard) Toilet Transfer Method: Proofreader: Raised toilet seat with arms (or 3-in-1 over toilet);Regular height toilet (able to do both) Toileting - Clothing Manipulation: Performed;Modified independent Where Assessed - Toileting Clothing Manipulation: Sit to stand from 3-in-1 or toilet Toileting - Hygiene: Performed;Modified independent Where Assessed - Toileting Hygiene: Sit to stand from 3-in-1 or toilet Ambulation Related to ADLs: Pt with occasional need for hand held assist while ambulating to bathroom. Vision/Perception    Cognition Cognition Arousal/Alertness: Awake/alert Overall Cognitive Status: Appears within functional limits for tasks assessed Orientation Level: Oriented X4 Cognition - Other Comments: Slow to process, WNL for age. Sensation/Coordination Sensation Light Touch: Appears Intact (bil. UE) Coordination Gross Motor Movements are Fluid and Coordinated: Yes Fine Motor Movements are Fluid and Coordinated: Yes Extremity Assessment RUE Assessment RUE Assessment: Within Functional Limits LUE Assessment LUE Assessment: Within Functional Limits Mobility  Bed Mobility Bed Mobility: Yes Rolling Left: 5: Supervision;With rail Left Sidelying to Sit: 5: Supervision Left Sidelying to Sit Details (indicate cue type and reason): verbal cues to maintain cervical precautions Sitting - Scoot to Edge of Bed: 6: Modified independent (Device/Increase time) Sit to Sidelying Left: 5: Supervision;HOB flat Sit to Sidelying Left Details (indicate cue type and reason): VC for technique in order to maintain back precautions Transfers Transfers: Yes Sit to Stand: 5: Supervision;With upper extremity assist;From bed;From chair/3-in-1;With armrests Stand to Sit: 5: Supervision;To  chair/3-in-1;To bed;With armrests Exercises   End of Session OT - End of Session Equipment Utilized During Treatment: Gait belt Activity Tolerance: Patient tolerated treatment well Patient left: in bed;with call bell in reach;with family/visitor present Nurse Communication: Mobility status for transfers General Behavior During Session: [facility] for tasks performed Cognition: [facility] for tasks performed   Cipriano Mile 08/22/2011, 2:40 PM  08/22/2011 Cipriano Mile OTR/L Pager [phone] Office [phone]

## 2011-08-22 NOTE — Progress Notes (Signed)
Subjective: Patient reports Feels fair after 3 level anterior cervical surgery  Objective: Vital signs in last 24 hours: Temp:  [97.3 F (36.3 C)-98.6 F (37 C)] 97.3 F (36.3 C) (03/02 1013) Pulse Rate:  [78-100] 79  (03/02 1013) Resp:  [10-20] 18  (03/02 1013) BP: (131-183)/(73-94) 131/78 mmHg (03/02 1013) SpO2:  [94 %-100 %] 94 % (03/02 1013) Weight:  [73.936 kg (163 lb)] 73.936 kg (163 lb) (03/01 1949)  Intake/Output from previous day: 03/01 0701 - 03/02 0700 In: 1560 [P.O.:360; I.V.:1200] Out: 102 [Urine:2; Blood:100] Intake/Output this shift: Total I/O In: 240 [P.O.:240] Out: -   Incision is clean and dry motor function is intact in the deltoids biceps triceps grips and intrinsics. Station and gait are normal  Lab Results:  Basename 08/20/11 1346  WBC 6.0  HGB 10.0*  HCT 30.5*  PLT 206   BMET  Basename 08/20/11 1346  NA 137  K 4.8  CL 105  CO2 24  GLUCOSE 88  BUN 34*  CREATININE 1.65*  CALCIUM 9.6    Studies/Results: Dg Cervical Spine 2-3 Views  08/21/2011  *RADIOLOGY REPORT*  Clinical Data: C4-7 ACDF.  CERVICAL SPINE - 2-3 VIEW  Comparison: Plain films cervical spine 08/12/2011.  Findings: We are provided two intraoperative views of the cervical spine in the lateral projection.  On film labeled #1, metallic probes localize C4-5 and C5-6.  On film #2, anterior plate and screws interbody spacers are in place from C4-C7.  Vertebral body height and alignment are maintained.  IMPRESSION: C4-7 ACDF.  Original Report Authenticated By: Bernadene Bell. Maricela Curet, M.D.    Assessment/Plan: Stable postop day 1 status post anterior cervical decompression C4-C7  LOS: 1 day  Continued to mobilize and ambulate patient until independent   Sanjith Siwek J 08/22/2011, 2:29 PM

## 2011-08-22 NOTE — Progress Notes (Signed)
CSW received consult for SNF. PT recommendation for HHPT noted. No other CSW needs reported or noted. CSW signing off. Please re-consult if SNF needed.  Kura Bethards, MSW, LCSWA [phone] (weekend) 

## 2011-08-22 NOTE — Plan of Care (Signed)
Problem: Consults Goal: Diagnosis - Spinal Surgery Outcome: Completed/Met Date Met:  08/22/11 Cervical Spine Fusion

## 2011-08-22 NOTE — Evaluation (Signed)
Physical Therapy Evaluation Patient Details Name: Victoria Rodgers MRN: [id number] DOB: August 19, 1927 Today's Date: 08/22/2011  Problem List: There is no problem list on file for this patient.   Past Medical History:  Past Medical History  Diagnosis Date  . Arthritis   . Neck pain     stenosis/buldging disc  . Bruises easily   . GERD (gastroesophageal reflux disease)     takes Nexium daily  . Colitis     takes Bentyly daily  . Cystitis     occasionally -sees Dr.Fennigan  . Anemia     sees Dr.Flannigan and gets Aranesp as needed;f/u every 3months   . Anxiety     takes Celexa daily  . Pneumonia     hx of at least 5-49yrs ago  . Hypertension     takes Benicar and Maxzide daily  . Hypothyroidism     takes Synthroid daily  . Blood transfusion     last time about 2 1/2 yrs ago   Past Surgical History:  Past Surgical History  Procedure Date  . Appendectomy   . Cesarean section   . Abdominal hysterectomy   . Total knee arthroplasty     left knee  . Total hip arthroplasty     right hip  . Back surgery     x 2  . Colonoscopy   . Cataracts      bilateral    PT Assessment/Plan/Recommendation PT Assessment Clinical Impression Statement: 78 year old s/p anteiror cervical decompression/discectomy and fusion 3 levels. Pt mobilizing very well with slight imbalance with gait. Pt will need supervision/min assist with gait initially. Discussed D/C with pt - her daughter is coming to help her for "as long as needed" after pt returns to retirement community. Pt will also have assit from her husband. Pt requests to return to independent living portion of retirement community with HHPT - if she has difficulty will take advice from HHPT whether she needs to go to SNF or ALF portion of her retirement community.  PT Recommendation/Assessment: Patient will need skilled PT in the acute care venue PT Problem List: Decreased activity tolerance;Decreased mobility;Decreased balance;Pain;Decreased  knowledge of use of DME Barriers to Discharge: None PT Therapy Diagnosis : Abnormality of gait;Difficulty walking;Acute pain PT Plan PT Frequency: Min 5X/week PT Treatment/Interventions: DME instruction;Gait training;Functional mobility training;Therapeutic activities;Therapeutic exercise;Balance training;Patient/family education (potentially may benefit from RW) PT Recommendation Follow Up Recommendations: Home health PT;Supervision for mobility/OOB Equipment Recommended: None recommended by PT PT Goals  Acute Rehab PT Goals PT Goal Formulation: With patient Time For Goal Achievement: 7 days Pt will Roll Supine to Right Side: Independently PT Goal: Rolling Supine to Right Side - Progress: Goal set today Pt will Roll Supine to Left Side: Independently PT Goal: Rolling Supine to Left Side - Progress: Goal set today Pt will go Supine/Side to Sit: with modified independence PT Goal: Supine/Side to Sit - Progress: Goal set today Pt will go Sit to Supine/Side: with modified independence PT Goal: Sit to Supine/Side - Progress: Goal set today Pt will go Sit to Stand: with modified independence PT Goal: Sit to Stand - Progress: Goal set today Pt will go Stand to Sit: with modified independence PT Goal: Stand to Sit - Progress: Goal set today Pt will Ambulate: >150 feet;with modified independence;with least restrictive assistive device PT Goal: Ambulate - Progress: Goal set today  PT Evaluation Precautions/Restrictions  Precautions Precautions: Other (comment);Fall (cervical precautions) Precaution Comments: Reviewed cervical precautions (no flexion/extension/side bending or rotation).  Required Braces or Orthoses: Yes Cervical Brace: Hard collar;Other (comment) (at all times) Restrictions Weight Bearing Restrictions: No Prior Functioning  Home Living Lives With: Spouse Type of Home: House (at retirement community [facility] Home Layout: One level Bathroom Shower/Tub: Walk-in  shower (with lots of bars) Bathroom Toilet: Handicapped height Bathroom Accessibility: Yes How Accessible: Accessible via wheelchair;Accessible via walker Home Adaptive Equipment: Shower chair with back;Bedside commode/3-in-1;Straight cane;Walker - rolling Prior Function Level of Independence: Independent with basic ADLs Driving: Yes Comments: Would like to get her life back, has an Geophysicist/field seismologist that does house work 2x a week Financial risk analyst Arousal/Alertness: Awake/alert Overall Cognitive Status: Appears within functional limits for tasks assessed Orientation Level: Oriented X4 Cognition - Other Comments: Slow to process, WNL for age. Sensation/Coordination Sensation Light Touch: Appears Intact (light touch bil. UEs.) Coordination Gross Motor Movements are Fluid and Coordinated: Yes Fine Motor Movements are Fluid and Coordinated: Yes Extremity Assessment RLE Assessment RLE Assessment: Within Functional Limits LLE Assessment LLE Assessment: Within Functional Limits Mobility (including Balance) Bed Mobility Bed Mobility: Yes Rolling Left: 5: Supervision Rolling Left Details (indicate cue type and reason): Verbal cues for log roll technique, cervical precautions.  Left Sidelying to Sit: 5: Supervision Left Sidelying to Sit Details (indicate cue type and reason): Verbal cues to keep cervical spine neutral, no physical assist needed.  Sitting - Scoot to Edge of Bed: 6: Modified independent (Device/Increase time) Transfers Transfers: Yes Sit to Stand: 5: Supervision;From bed;From toilet Sit to Stand Details (indicate cue type and reason): Verbal cues for UE placement, no physical assist needed.  Stand to Sit: 5: Supervision;With upper extremity assist;To chair/3-in-1;To toilet Stand to Sit Details: Supervision for safety. Cues to control descent. Requires railing beside toilet - pt reporting she has rail as well as 3n1 at home.  Ambulation/Gait Ambulation/Gait:  Yes Ambulation/Gait Assistance: 4: Min assist Ambulation/Gait Assistance Details (indicate cue type and reason): Up to min assist for slight losses of balance. Pt with widdened BOS for stability. Overal pt mostly min-guard assist. Advised pt to have min assist/min-guard assist with all ambulation at this time.  Ambulation Distance (Feet): 275 Feet Assistive device: None Gait Pattern: Step-through pattern;Decreased stride length (Decreased foot clearance bil., wide base of support) Stairs: No  Posture/Postural Control Posture/Postural Control: No significant limitations Balance Balance Assessed: Yes Static Sitting Balance Static Sitting - Balance Support: No upper extremity supported;Feet supported Static Sitting - Level of Assistance: 6: Modified independent (Device/Increase time) Static Standing Balance Static Standing - Balance Support: No upper extremity supported Static Standing - Level of Assistance: 5: Stand by assistance Static Standing - Comment/# of Minutes: Performed normal stance and tandem with eyes open and eyes closed. Pt with increased sway with eyes closed however opens eyes prior to needing assist (~10-15 sec).  Exercise  General Exercises - Lower Extremity Ankle Circles/Pumps: AROM;10 reps;Seated End of Session PT - End of Session Equipment Utilized During Treatment: Gait belt;Cervical collar Activity Tolerance: Patient tolerated treatment well Patient left: in chair;with call bell in reach General Behavior During Session: [facility] for tasks performed Cognition: [facility] for tasks performed  Sherie Don 08/22/2011, 10:09 AM  Sherie Don) Carleene Mains PT, DPT Acute Rehabilitation [phone]

## 2011-08-23 ENCOUNTER — Other Ambulatory Visit: Payer: Self-pay

## 2011-08-23 LAB — COMPREHENSIVE METABOLIC PANEL
Albumin: 3.2 g/dL — ABNORMAL LOW (ref 3.5–5.2)
BUN: 14 mg/dL (ref 6–23)
Calcium: 8.6 mg/dL (ref 8.4–10.5)
Creatinine, Ser: 1.1 mg/dL (ref 0.50–1.10)
Total Protein: 6.2 g/dL (ref 6.0–8.3)

## 2011-08-23 LAB — CARDIAC PANEL(CRET KIN+CKTOT+MB+TROPI)
CK, MB: 3.2 ng/mL (ref 0.3–4.0)
CK, MB: 2.3 ng/mL (ref 0.3–4.0)
Relative Index: 0.7 (ref 0.0–2.5)
Total CK: 497 U/L — ABNORMAL HIGH (ref 7–177)
Total CK: 457 U/L — ABNORMAL HIGH (ref 7–177)
Total CK: 368 U/L — ABNORMAL HIGH (ref 7–177)

## 2011-08-23 LAB — CBC
HCT: 26.7 % — ABNORMAL LOW (ref 36.0–46.0)
MCHC: 33.7 g/dL (ref 30.0–36.0)
MCV: 92.7 fL (ref 78.0–100.0)
RDW: 13 % (ref 11.5–15.5)

## 2011-08-23 MED ORDER — NITROGLYCERIN 0.4 MG SL SUBL
SUBLINGUAL_TABLET | SUBLINGUAL | Status: AC
  Administered 2011-08-23: 0.4 mg via SUBLINGUAL
  Filled 2011-08-23: qty 25

## 2011-08-23 MED ORDER — ALUM & MAG HYDROXIDE-SIMETH 200-200-20 MG/5ML PO SUSP: 30.0000 mL | Freq: Four times a day (QID) | ORAL | Status: DC | PRN

## 2011-08-23 NOTE — Progress Notes (Signed)
F/u: pt still with c/o chest pressure though she states that her throat pain has been relieved.  Pt with equal strength BLE, and generalized upper ext weakness.  Pt states she feels "achy like when you have the flu".  Spoke with Dr. Franky Macho & updated him about the pt's status.  Labs pending.  Plan to tx to tele for cardiac monitoring.  VSS. Will cont. To monitor.

## 2011-08-23 NOTE — Progress Notes (Signed)
Called by primary RN to see pt for c/o chest pressure and sore throat.  On arrival pt sitting up in bed, wearing neck brace, alert and in no apparent distress.  Spoke with the patient about how she was feeling and discovered the pt's discomfort began more than 24 hrs ago with the pain in her throat coming and going, pain in throat 5/10.  Pt states she has reflux pain on occasion.   EKG reviewed and wnl.  Cardiac enzymes pending. Pt given Maalox & pain meds per MD order by primary RN.  Will cont. To monitor.

## 2011-08-23 NOTE — Progress Notes (Signed)
Pt. Transferred from 3000 c/o substernal CP radiating to bilateral shoulders and arms. Describes pain as "elephant on my chest" .  EKG normal. See MAR for NTG SL administration and vital signs as well as pain level. Pt. Did have short run of SVT during treatment of CP. CK elevated, otherwise, CKMB and troponin normal. All info called to MD on-call. No new orders received. Will monitor.

## 2011-08-23 NOTE — Progress Notes (Addendum)
Subjective: Patient reports chest pain. Feels like an elephant is sitting on my chest. pain is in my arms also. Hard to swallow.  Objective: Vital signs in last 24 hours: Temp:  [97.3 F (36.3 C)-98.9 F (37.2 C)] 98.9 F (37.2 C) (03/03 0517) Pulse Rate:  [68-112] 85  (03/03 0517) Resp:  [18-20] 20  (03/03 0517) BP: (131-181)/(77-96) 160/78 mmHg (03/03 0517) SpO2:  [94 %-97 %] 97 % (03/03 0517)  Intake/Output from previous day: 03/02 0701 - 03/03 0700 In: 480 [P.O.:480] Out: 5 [Urine:5] Intake/Output this shift:    Neurologic: Alert and oriented X 3, normal strength and tone. Normal symmetric reflexes. Normal coordination and gait Cardio: regular rate and rhythm, S1, S2 normal, no murmur, click, rub or gallop Wound:Clean, dry, no signs of infection  Lab Results:  Basename 08/20/11 1346  WBC 6.0  HGB 10.0*  HCT 30.5*  PLT 206   BMET  Basename 08/20/11 1346  NA 137  K 4.8  CL 105  CO2 24  GLUCOSE 88  BUN 34*  CREATININE 1.65*  CALCIUM 9.6    Studies/Results: Dg Cervical Spine 2-3 Views  08/21/2011  *RADIOLOGY REPORT*  Clinical Data: C4-7 ACDF.  CERVICAL SPINE - 2-3 VIEW  Comparison: Plain films cervical spine 08/12/2011.  Findings: We are provided two intraoperative views of the cervical spine in the lateral projection.  On film labeled #1, metallic probes localize C4-5 and C5-6.  On film #2, anterior plate and screws interbody spacers are in place from C4-C7.  Vertebral body height and alignment are maintained.  IMPRESSION: C4-7 ACDF.  Original Report Authenticated By: Bernadene Bell. Maricela Curet, M.D.    Assessment/Plan: Mrs. Custis's chest pain has not been relieved by pain meds, or time. The EKG was unchanged. Enzymes have been sent per the rapid response team. She is awaiting a bed on 2000.  LOS: 2 days     Randell Detter L 08/23/2011, 7:17 AM

## 2011-08-23 NOTE — Progress Notes (Signed)
Physical Therapy Treatment Patient Details Name: Victoria Rodgers MRN: [id number] DOB: October 18, 1927 Today's Date: 08/23/2011  PT Assessment/Plan  PT - Assessment/Plan Comments on Treatment Session: Pt with limited participation today due to not feeling well (now with upset stomach/nausea). Denies any chest pain currently. Daughter present for session and educated on use of walker with future out of bed gait for safety. PT Plan: Discharge plan remains appropriate;Frequency remains appropriate PT Frequency: Min 5X/week Follow Up Recommendations: Home health PT;Supervision for mobility/OOB Equipment Recommended: Rolling walker with 5" wheels PT Goals  Acute Rehab PT Goals PT Goal: Sit to Supine/Side - Progress: Progressing toward goal PT Goal: Sit to Stand - Progress: Progressing toward goal PT Goal: Stand to Sit - Progress: Progressing toward goal PT Goal: Ambulate - Progress: Progressing toward goal  PT Treatment Precautions/Restrictions  Precautions Precautions: Other (comment) Precaution Comments: pt with cervical precautions (no flexion/extension/side bending (lateral flexion) or rotation).  Required Braces or Orthoses: Yes Cervical Brace: Hard collar;Other (comment) (hard cervical collar at all times) Restrictions Weight Bearing Restrictions: No Mobility (including Balance) Bed Mobility Sit to Sidelying Left: 4: Min assist;HOB flat;With rail Sit to Sidelying Left Details (indicate cue type and reason): cues for technique to decrease pressure/stress to cervical spine. supervision for pt to roll onto back once lying down. Transfers Sit to Stand: 5: Supervision;From toilet;With upper extremity assist;4: Min assist Sit to Stand Details (indicate cue type and reason): cues for hand placement (to use grab bar). min assist to steady pt once she was standing. Stand to Sit: To bed;With upper extremity assist Stand to Sit Details: pt was already seated on toilet upon arrival to room.  supervison with sittting to bed with cues for hand placement. Ambulation/Gait Ambulation/Gait Assistance: 4: Min assist Ambulation/Gait Assistance Details (indicate cue type and reason): min hand held assist with gait from bathroom to bed with pt "furniture walking" with other hand. demo'd mild unsteadiness with short, shuffled steps. RW placed in room at end of session for pt to use with future gait trials for increased safetey and steadiness. Ambulation Distance (Feet): 14 Feet Gait Pattern: Step-to pattern;Decreased stride length;Shuffle  Posture/Postural Control Posture/Postural Control: No significant limitations   End of Session PT - End of Session Equipment Utilized During Treatment: Gait belt;Cervical collar Activity Tolerance: Patient tolerated treatment well;Patient limited by fatigue Patient left: in bed;with call bell in reach;with family/visitor present Nurse Communication: Mobility status for transfers;Mobility status for ambulation General Behavior During Session: [facility] for tasks performed Cognition: [facility] for tasks performed  Sallyanne Kuster 08/23/2011, 3:31 PM  Sallyanne Kuster, PTA Office- [phone] Weekend Pager- [phone]

## 2011-08-24 LAB — BASIC METABOLIC PANEL
CO2: 27 mEq/L (ref 19–32)
CO2: 28 mEq/L (ref 19–32)
Calcium: 9.3 mg/dL (ref 8.4–10.5)
Chloride: 86 mEq/L — ABNORMAL LOW (ref 96–112)
Chloride: 87 mEq/L — ABNORMAL LOW (ref 96–112)
GFR calc non Af Amer: 47 mL/min — ABNORMAL LOW (ref >90)
Glucose, Bld: 151 mg/dL — ABNORMAL HIGH (ref 70–99)
Potassium: 4.1 mEq/L (ref 3.5–5.1)
Potassium: 4.2 mEq/L (ref 3.5–5.1)
Sodium: 121 mEq/L — ABNORMAL LOW (ref 135–145)
Sodium: 123 mEq/L — ABNORMAL LOW (ref 135–145)

## 2011-08-24 LAB — URINALYSIS, ROUTINE W REFLEX MICROSCOPIC
Leukocytes, UA: NEGATIVE
Nitrite: NEGATIVE
Protein, ur: NEGATIVE mg/dL
Specific Gravity, Urine: 1.01 (ref 1.005–1.030)
Urobilinogen, UA: 0.2 mg/dL (ref 0.0–1.0)

## 2011-08-24 LAB — OSMOLALITY, URINE: Osmolality, Ur: 308 mOsm/kg — ABNORMAL LOW (ref 390–1090)

## 2011-08-24 LAB — SODIUM: Sodium: 122 mEq/L — ABNORMAL LOW (ref 135–145)

## 2011-08-24 LAB — SODIUM, URINE, RANDOM: Sodium, Ur: 90 mEq/L

## 2011-08-24 MED ORDER — SODIUM CHLORIDE 1 G PO TABS
1.0000 g | ORAL_TABLET | Freq: Two times a day (BID) | ORAL | Status: DC
  Administered 2011-08-24 – 2011-08-25 (×2): 1 g via ORAL
  Filled 2011-08-24 (×4): qty 1

## 2011-08-24 MED ORDER — SODIUM CHLORIDE 0.9 % IV SOLN
INTRAVENOUS | Status: DC
  Administered 2011-08-24: 13:00:00 via INTRAVENOUS

## 2011-08-24 MED ORDER — AMLODIPINE BESYLATE 5 MG PO TABS
5.0000 mg | ORAL_TABLET | Freq: Every day | ORAL | Status: DC
  Administered 2011-08-24: 5 mg via ORAL
  Filled 2011-08-24 (×2): qty 1

## 2011-08-24 NOTE — Progress Notes (Signed)
PT Cancellation Note  Treatment cancelled today due to pt having just ambulated. Will f/u up later this morning per pt request. .  Ivonne Andrew 08/24/2011, 8:43 AM

## 2011-08-24 NOTE — Progress Notes (Signed)
Patient ID: Victoria Rodgers, female   DOB: 05-Mar-1928, 78 y.o.   MRN: [id number] Hospitalist c/s requested by Dr. Venetia Maxon for evaluation and tx hyponatremia. Spoke with Dr. Isidoro Donning. Thank you for your assistance.  Georgiann Cocker, RN, BSN

## 2011-08-24 NOTE — Progress Notes (Signed)
Physical Therapy Treatment Patient Details Name: Victoria Rodgers MRN: [id number] DOB: Nov 14, 1927 Today's Date: 08/24/2011  PT Assessment/Plan  PT - Assessment/Plan Comments on Treatment Session: Doing well today. More independent. Pt could likely progress to outpatient therapy when cleared by Dr. Venetia Maxon for participation in balance and gait training. Pt educated on safety risk for falling especially following this surgery. She was modified independent today with the RW and ed pt on the importance of using this when by herself.  PT Plan: Discharge plan needs to be updated;Frequency remains appropriate Follow Up Recommendations: Outpatient PT with 24 hour assist/supervision at home (outpatient PT when cleared by Dr. Venetia Maxon)  Equipment Recommended: None recommended by PT PT Goals  Acute Rehab PT Goals PT Goal: Supine/Side to Sit - Progress: Met PT Goal: Sit to Supine/Side - Progress: Met PT Goal: Sit to Stand - Progress: Met PT Goal: Stand to Sit - Progress: Met Pt will Ambulate: >150 feet;Independently PT Goal: Ambulate - Progress: Updated due to goal met  PT Treatment Precautions/Restrictions  Precautions Precautions: Other (comment) Precaution Comments: pt with cervical precautions (no flexion/extension/side bending (lateral flexion) or rotation).  Required Braces or Orthoses: Yes Cervical Brace: Hard collar Restrictions Weight Bearing Restrictions: No Mobility (including Balance) Bed Mobility Rolling Left: 6: Modified independent (Device/Increase time);With rail Left Sidelying to Sit: 5: Supervision Supine to Sit: 7: Independent;HOB flat Sitting - Scoot to Edge of Bed: 7: Independent Sit to Supine: 6: Modified independent (Device/Increase time);HOB elevated (comment degrees) (60 degrees) Transfers Sit to Stand: 6: Modified independent (Device/Increase time);With upper extremity assist;From bed Stand to Sit: 6: Modified independent (Device/Increase time);To bed;With upper  extremity assist Ambulation/Gait Ambulation/Gait Assistance Details (indicate cue type and reason): amb 100 ft modI with RW; pt insistent that she didn't need the RW so the RW was taken away and the pt then ambulated another 100 ft with mingaurdA, with no RW pts BOS widended and her steps became shorter,  pt occassionally reaching out for upper extremity assist Ambulation Distance (Feet): 200 Feet Assistive device: Rolling walker;None Stairs: No  Static Standing Balance Static Standing - Balance Support: No upper extremity supported Static Standing - Level of Assistance: 5: Stand by assistance Static Standing - Comment/# of Minutes: slight sway immediately upon standing with no upper extremity support  Dynamic Standing Balance Dynamic Standing - Balance Support: No upper extremity supported Dynamic Standing - Level of Assistance: 5: Stand by assistance Dynamic Standing - Balance Activities: Lateral lean/weight shifting;Forward lean/weight shifting Exercise  General Exercises - Lower Extremity Hip ABduction/ADduction: AROM;Both;10 reps;Standing (upper extremity support at sink) Toe Raises: AROM;Both;10 reps;Standing Heel Raises: AROM;Both;10 reps;Standing (with minA for technique) End of Session PT - End of Session Equipment Utilized During Treatment: Gait belt Activity Tolerance: Patient tolerated treatment well Patient left: in bed;with call bell in reach;with bed alarm set Nurse Communication: Mobility status for transfers;Mobility status for ambulation General Behavior During Session: [facility] for tasks performed Cognition: [facility] for tasks performed  [facility] HELEN 08/24/2011, 1:07 PM

## 2011-08-24 NOTE — Progress Notes (Signed)
Called lab about STAT BMet for 1600. MD concerned this has not been done yet. Will be completed soon by lab.

## 2011-08-24 NOTE — Progress Notes (Signed)
Occupational Therapy Treatment Patient Details Name: Victoria Rodgers MRN: [id number] DOB: 1927/08/13 Today's Date: 08/24/2011  OT Assessment/Plan OT Assessment/Plan Comments on Treatment Session: Pt progressing well. States she feels "much better" today OT Plan: Discharge plan remains appropriate OT Frequency: Min 2X/week Follow Up Recommendations: No OT follow up;Supervision - Intermittent Equipment Recommended: None recommended by OT OT Goals ADL Goals ADL Goal: Grooming - Progress: Progressing toward goals ADL Goal: Toilet Transfer - Progress: Progressing toward goals Additional ADL Goal #1: Pt will be able to I'ly instruct caregiver/family in collar don/doffing and care. ADL Goal: Additional Goal #1 - Progress: Progressing toward goals Miscellaneous OT Goals OT Goal: Miscellaneous Goal #1 - Progress: Progressing toward goals  OT Treatment Precautions/Restrictions  Precautions Precaution Comments: pt with cervical precautions (no flexion/extension/side bending (lateral flexion) or rotation).  Cervical Brace: Hard collar Restrictions Weight Bearing Restrictions: No   ADL ADL Eating/Feeding Details (indicate cue type and reason): Educated pt on placing washcloth on collar while eating to keep it from getting unnecessarily soiled Grooming: Performed;Teeth care;Supervision/safety Grooming Details (indicate cue type and reason): Instructed pt to use cup to spit in while brushing teeth so as to avoid bending neck Where Assessed - Grooming: Standing at sink Toilet Transfer: Performed;Supervision/safety Toilet Transfer Method: Proofreader: Regular height toilet;Grab bars Toileting - Clothing Manipulation: Performed;Modified independent Toileting - Clothing Manipulation Details (indicate cue type and reason): held to grab rail to keep balance Where Assessed - Toileting Clothing Manipulation: Standing Toileting - Hygiene: Performed;Independent Where  Assessed - Toileting Hygiene: Sit on 3-in-1 or toilet Ambulation Related to ADLs: Pt S with RW ambulation to and from bathroom (no RW used) ADL Comments: Educated pt on removal and cleaning of [place] collar pads. Pt with no pads in the room- states she will ask family if they have taken them home. If pads are lost, will need to re-order a set of pads prior to d/c. Also, educated pt on use of [place] collar for showering. Spoke with RN and asked her to order this prior to d/c. Mobility  Bed Mobility Rolling Left: 6: Modified independent (Device/Increase time);With rail Left Sidelying to Sit: 5: Supervision Sit to Supine: 7: Independent  End of Session OT - End of Session Equipment Utilized During Treatment: Gait belt;Cervical collar Activity Tolerance: Patient tolerated treatment well Patient left: in bed;with call bell in reach General Behavior During Session: [facility] for tasks performed Cognition: [facility] for tasks performed  Adel Burch  08/24/2011, 11:36 AM

## 2011-08-24 NOTE — Progress Notes (Signed)
Subjective: Feeling better.  Thinks she had an "anxiety attack."  Objective: Vital signs in last 24 hours: Temp:  [98.5 F (36.9 C)-99.4 F (37.4 C)] 98.5 F (36.9 C) (03/04 0428) Pulse Rate:  [72-77] 72  (03/04 0428) Resp:  [19-20] 19  (03/04 0428) BP: (148-169)/(74-80) 169/80 mmHg (03/04 0428) SpO2:  [92 %-97 %] 97 % (03/04 0428) Weight change:  Last BM Date: 08/22/11  Intake/Output from previous day: 03/03 0701 - 03/04 0700 In: 240 [P.O.:240] Out: -  Last cbgs: CBG (last 3)  No results found for this basename: GLUCAP:3 in the last 72 hours   Physical Exam Full strength, dressing CDI  Lab Results:  [facility] 08/23/11 0708  WBC 14.1*  HGB 9.0*  HCT 26.7*  PLT 196   BMET  Basename 08/23/11 0708  NA 120*  K 4.3  CL 88*  CO2 23  GLUCOSE 122*  BUN 14  CREATININE 1.10  CALCIUM 8.6    Studies/Results: No results found.  Medications: I have reviewed the patient's current medications.  Assessment/Plan: Hyponatremia, will recheck.  If ok, my DC home. Cardiac enzymes ok.  Dorian Heckle , MD 08/24/2011, 8:12 AM

## 2011-08-24 NOTE — Consult Note (Addendum)
Medical Consultation   Victoria Rodgers  GNF:[id number]  DOB: Sep 24, 1927  DOA: 08/21/2011  PCP: Gwynneth Albright, MD, MD  Requesting physician: Dr. Imelda Pillow  Reason for consultation: Hyponatremia   History of Present Illness: Patient is 78 year old female with history of hypertension, hypothyroidism, GERD who is currently admitted under neurosurgery, underwent anterior cervical decompression/discectomy fusion of cervical spine on 08/21/2011. Patient was noted to have hyponatremia 120, on 08/23/2011 and 121 on 08/24/2011. Hospitalist service was requested for evaluation of hyponatremia. At the time of encounter, patient is alert and oriented x3, no specific complaints except that she was having frequent urination last night but no dysuria or hematuria. She also has not been eating very well during hospitalization, denies any prior issues with hyponatremia. Patient was noted to be on Celexa, HCTZ/triamterene which can also cause hyponatremia.  Allergies:   Allergies  Allergen Reactions  . Sulfa Antibiotics Nausea Only      Past Medical History  Diagnosis Date  . Arthritis   . Neck pain     stenosis/buldging disc  . Bruises easily   . GERD (gastroesophageal reflux disease)     takes Nexium daily  . Colitis     takes Bentyly daily  . Cystitis     occasionally -sees Dr.Fennigan  . Anemia     sees Dr.Flannigan and gets Aranesp as needed;f/u every 3months   . Anxiety     takes Celexa daily  . Pneumonia     hx of at least 5-48yrs ago  . Hypertension     takes Benicar and Maxzide daily  . Hypothyroidism     takes Synthroid daily  . Blood transfusion     last time about 2 1/2 yrs ago    Past Surgical History  Procedure Date  . Appendectomy   . Cesarean section   . Abdominal hysterectomy   . Total knee arthroplasty     left knee  . Total hip arthroplasty     right hip  . Back surgery     x 2  . Colonoscopy   . Cataracts      bilateral    Social History:  reports  that she has quit smoking. She does not have any smokeless tobacco history on file. She reports that she does not drink alcohol or use illicit drugs.  Family History  Problem Relation Age of Onset  . Anesthesia problems Neg Hx   . Hypotension Neg Hx   . Malignant hyperthermia Neg Hx   . Pseudochol deficiency Neg Hx     Review of Systems:  Review of Systems:  Constitutional: Denies fever, chills, diaphoresis, appetite change and fatigue.  HEENT: Denies photophobia, eye pain, redness, hearing loss, ear pain, congestion, sore throat, rhinorrhea, sneezing, mouth sores, trouble swallowing, neck pain,C collar in place Respiratory: Denies SOB, DOE, cough, chest tightness,  and wheezing.   Cardiovascular: Denies chest pain, palpitations and leg swelling.  Gastrointestinal: Denies nausea, vomiting, abdominal pain, diarrhea, constipation, blood in stool and abdominal distention.  Genitourinary: states had to urinate everyhour last night but no dysuria/hematuria  Musculoskeletal: Denies myalgias, back pain, joint swelling, arthralgias and gait problem.  Skin: Denies pallor, rash and wound.  Neurological: Denies dizziness, seizures, syncope, weakness, light-headedness, numbness and headaches.  Hematological: Denies adenopathy. Easy bruising, personal or family bleeding history  Psychiatric/Behavioral: Denies suicidal ideation, mood changes, confusion, nervousness, sleep disturbance and agitation   Physical Exam: Blood pressure 169/80, pulse 72, temperature 98.5 F (36.9 C), temperature source Oral, resp. rate 19,  height 5\' 7"  (1.702 m), weight 73.936 kg (163 lb), SpO2 97.00%.  General: Alert and awake, oriented x3, not in any acute distress. HEENT: anicteric sclera, pupils reactive to light and accommodation, EOMI, C collar in place CVS: S1-S2 clear, no murmur rubs or gallops Chest: clear to auscultation bilaterally, no wheezing, rales or rhonchi Abdomen: soft nontender, nondistended, normal  bowel sounds, no organomegaly Extremities: no cyanosis, clubbing or edema noted bilaterally Neuro: Cranial nerves II-XII intact, no focal neurological deficits  Labs on Admission:  Basic Metabolic Panel:  Lab 08/24/11 6213 08/23/11 0708  NA 121* 120*  K 4.1 4.3  CL 86* 88*  CO2 27 23  GLUCOSE 151* 122*  BUN 12 14  CREATININE 1.08 1.10  CALCIUM 8.9 8.6  MG -- --  PHOS -- --   Liver Function Tests:  Lab 08/23/11 0708  AST 26  ALT 8  ALKPHOS 45  BILITOT 1.2  PROT 6.2  ALBUMIN 3.2*    CBC:  Lab 08/23/11 0708 08/20/11 1346  WBC 14.1* 6.0  NEUTROABS -- --  HGB 9.0* 10.0*  HCT 26.7* 30.5*  MCV 92.7 95.3  PLT 196 206   Cardiac Enzymes:  Lab 08/23/11 2147 08/23/11 1332 08/23/11 0708  CKTOTAL 368* 457* 497*  CKMB 2.3 3.2 3.7  CKMBINDEX -- -- --  TROPONINI <0.30 <0.30 <0.30    Inpatient Medications:   Scheduled Meds:   . amLODipine  5 mg Oral Daily  . budesonide  3 mg Oral q morning - 10a  . dicyclomine  10 mg Oral Daily  . estrogens (conjugated)  0.3 mg Oral Daily  . irbesartan  75 mg Oral Daily  . levothyroxine  88 mcg Oral QAC breakfast  . pantoprazole  40 mg Oral Daily  . DISCONTD: citalopram  20 mg Oral Daily  . DISCONTD: triamterene-hydrochlorothiazide  1 each Oral Daily   Continuous Infusions:   . sodium chloride       Radiological Exams on Admission: No results found.  Impression/Recommendations Active Problems:  - Hyponatremia: Calculated serum osmolality is 255, measured 251, patient has hypo-osmolar hyponatremia, patient is alert and oriented with no neurological symptoms.  - Ordered stat serum osmolarity, urine osmolarity, urine sodium, TSH for further workup - Given hypo-osmolarity, I am giving her a fluid challenge, recheck sodium level at 4 PM today to see the trend - I have discontinued Celexa, HCTZ/triamterene, encouraged by mouth diet - However, there is 5%- even 100% incidence reported in literature of postoperative SIADH in  patients who have undergone spinal surgeries. I will followup on the labs today. If patient indeed has SIADH, will do free water restriction, and +/- salt tablets if needed. Hopefully, hyponatremia we'll correct within the next 24 hours.     - Hypertension: I have added Norvasc as I am discontinuing triamterene/HCTZ    I will followup again in the morning. Please contact me if I can be of assistance in the meanwhile. Thank you for this consultation.  Time Spent on Admission: 45 minutes  Tamy Accardo M.D. [facility] 08/24/2011, 11:48 AM     Addendum: I reviewed the labs of the patient before and after the fluid challenge. Urine osmolarity 308, urine sodium 90, serum osmolarity 252 and no significant improvement after fluid challenge suggest SIADH which is commonly seen after spinal surgeries and can be self-limiting and spontaneously resolves in 2-3 weeks. I recommend, discontinuing Celexa and triamterene/HCTZ from her home medication at the time of discharge. I have placed her on free water restriction  and salt tablets (for next 5 days). She can have BMET rechecked again on Friday with her PCP. I will followup on the BMET tomorrow morning.   Roann Merk M.D. [facility] 08/24/2011, 6:27 PM  Pager: [phone]

## 2011-08-25 LAB — BASIC METABOLIC PANEL
CO2: 27 mEq/L (ref 19–32)
Chloride: 90 mEq/L — ABNORMAL LOW (ref 96–112)
GFR calc non Af Amer: 44 mL/min — ABNORMAL LOW (ref >90)
Glucose, Bld: 103 mg/dL — ABNORMAL HIGH (ref 70–99)
Potassium: 4.2 mEq/L (ref 3.5–5.1)
Sodium: 125 mEq/L — ABNORMAL LOW (ref 135–145)

## 2011-08-25 LAB — URINE CULTURE

## 2011-08-25 MED ORDER — SODIUM CHLORIDE 1 G PO TABS: 1.0000 g | ORAL_TABLET | Freq: Two times a day (BID) | ORAL | Status: DC

## 2011-08-25 MED ORDER — AMLODIPINE BESYLATE 10 MG PO TABS
10.0000 mg | ORAL_TABLET | Freq: Every day | ORAL | Status: DC
  Administered 2011-08-25: 10 mg via ORAL
  Filled 2011-08-25: qty 1

## 2011-08-25 MED ORDER — AMLODIPINE BESYLATE 10 MG PO TABS: 10.0000 mg | ORAL_TABLET | Freq: Every day | ORAL | Status: DC

## 2011-08-25 NOTE — Progress Notes (Signed)
Patient ID: Victoria Rodgers    ZOX:[id number]    DOB: [birth year]    DOA: 08/21/2011  PCP: Gwynneth Albright, MD, MD  Subjective: Patient seen and examined, no complaints, awaiting to be discharged  Objective: Weight change:   Intake/Output Summary (Last 24 hours) at 08/25/11 1350 Last data filed at 08/25/11 0635  Gross per 24 hour  Intake    240 ml  Output      4 ml  Net    236 ml   Blood pressure 164/84, pulse 77, temperature 97.6 F (36.4 C), temperature source Oral, resp. rate 18, height 5\' 7"  (1.702 m), weight 73.936 kg (163 lb), SpO2 96.00%.  Physical Exam: General: Alert and awake, oriented x3, not in any acute distress. HEENT: anicteric sclera, pupils reactive to light and accommodation, EOMI, c collar CVS: S1-S2 clear, no murmur rubs or gallops Chest: clear to auscultation bilaterally, no wheezing, rales or rhonchi Abdomen: soft nontender, nondistended, normal bowel sounds, no organomegaly Extremities: no cyanosis, clubbing or edema noted bilaterally Neuro: Cranial nerves II-XII intact, no focal neurological deficits  Lab Results: Basic Metabolic Panel:  Lab 08/25/11 8119 08/24/11 1806  NA 125* 123*  K 4.2 4.2  CL 90* 87*  CO2 27 28  GLUCOSE 103* 127*  BUN 11 12  CREATININE 1.13* 1.12*  CALCIUM 9.6 9.3  MG -- --  PHOS -- --   Liver Function Tests:  Lab 08/23/11 0708  AST 26  ALT 8  ALKPHOS 45  BILITOT 1.2  PROT 6.2  ALBUMIN 3.2*   CBC:  Lab 08/23/11 0708 08/20/11 1346  WBC 14.1* 6.0  NEUTROABS -- --  HGB 9.0* 10.0*  HCT 26.7* 30.5*  MCV 92.7 95.3  PLT 196 206   Cardiac Enzymes:  Lab 08/23/11 2147 08/23/11 1332 08/23/11 0708  CKTOTAL 368* 457* 497*  CKMB 2.3 3.2 3.7  CKMBINDEX -- -- --  TROPONINI <0.30 <0.30 <0.30   BNP: No components found with this basename: POCBNP:2 CBG: No results found for this basename: GLUCAP:5 in the last 168 hours   Micro Results: Recent Results (from the past 240 hour(s))  SURGICAL PCR SCREEN     Status:  Normal   Collection Time   08/20/11  1:36 PM      Component Value Range Status Comment   MRSA, PCR NEGATIVE  NEGATIVE  Final    Staphylococcus aureus NEGATIVE  NEGATIVE  Final   URINE CULTURE     Status: Normal   Collection Time   08/24/11  1:34 PM      Component Value Range Status Comment   Specimen Description URINE, CLEAN CATCH   Final    Special Requests NONE   Final    Culture  Setup Time [id number]   Final    Colony Count NO GROWTH   Final    Culture NO GROWTH   Final    Report Status 08/25/2011 FINAL   Final     Studies/Results: Dg Chest 2 View  08/20/2011  *RADIOLOGY REPORT*  Clinical Data: Preop.  CHEST - 2 VIEW  Comparison: 04/13/2006.  Findings: Trachea is midline.  Heart size normal.  Lungs are clear. No pleural fluid.  IMPRESSION: No acute findings.  Original Report Authenticated By: Reyes Ivan, M.D.   Dg Cervical Spine 2-3 Views  08/21/2011  *RADIOLOGY REPORT*  Clinical Data: C4-7 ACDF.  CERVICAL SPINE - 2-3 VIEW  Comparison: Plain films cervical spine 08/12/2011.  Findings: We are provided two intraoperative views of the cervical spine in  the lateral projection.  On film labeled #1, metallic probes localize C4-5 and C5-6.  On film #2, anterior plate and screws interbody spacers are in place from C4-C7.  Vertebral body height and alignment are maintained.  IMPRESSION: C4-7 ACDF.  Original Report Authenticated By: Bernadene Bell. Maricela Curet, M.D.   Mr Cervical Spine Wo Contrast  07/31/2011  *RADIOLOGY REPORT*  Clinical Data: Right arm pain and numbness  MRI CERVICAL SPINE WITHOUT CONTRAST  Technique:  Multiplanar and multiecho pulse sequences of the cervical spine, to include the craniocervical junction and cervicothoracic junction, were obtained according to standard protocol without intravenous contrast.  Comparison: None.  Findings: Cervical kyphosis is present.  Multilevel degenerative changes throughout the cervical spine.  Negative for fracture or mass.  No cord edema is  present.  C2-3:  Mild disc degeneration and uncinate spurring on the right. Right foraminal narrowing is present.  C3-4:  Disc degeneration and spondylosis.  Diffuse uncinate spurring with mild spinal stenosis.  Bilateral facet hypertrophy.  C4-5:  Disc degeneration and spondylosis.  Bilateral facet degeneration and mild spinal stenosis.  C5-6:  Disc degeneration with extensive spondylosis.  Prominent posterior osteophytes cause flattening of the cord with moderate spinal stenosis and moderate foraminal encroachment bilaterally.  C6-7:  Advanced spondylosis with large posterior osteophytes causing moderate spinal stenosis and cord deformity.  Moderate to severe foraminal encroachment bilaterally.  C7-T1:  3 mm anterior slip with disc and facet degeneration.  Mild spinal stenosis.  T1-2:  Negative  IMPRESSION: Advanced cervical spondylosis.  This is moderately severe at C4-5 and severe at C5-6 and C6-7.  Moderate spinal stenosis and significant foraminal encroachment bilaterally at C5-6 and C6-7.  Original Report Authenticated By: Camelia Phenes, M.D.    Medications: Scheduled Meds:   . amLODipine  10 mg Oral Daily  . budesonide  3 mg Oral q morning - 10a  . dicyclomine  10 mg Oral Daily  . estrogens (conjugated)  0.3 mg Oral Daily  . irbesartan  75 mg Oral Daily  . levothyroxine  88 mcg Oral QAC breakfast  . pantoprazole  40 mg Oral Daily  . sodium chloride  1 g Oral BID WC  . DISCONTD: amLODipine  5 mg Oral Daily   Continuous Infusions:   . DISCONTD: sodium chloride 100 mL/hr at 08/24/11 1231     Assessment/Plan:   Hyponatremia: sec to SIADH, Na level improving, pt is alert and oriented, ambulating.  - Explained to the patient about fluid restriction, salt tabs, to stop if BMET shows Na >135 on Friday with her PCP. - Explained to the patient SIADH which is commonly seen after spinal surgeries and can be self-limiting and spontaneously resolves in 2-3 weeks. - No celexa and  triamterene/HCTZ at the time of discharge.   - Hypertension: I have added Norvasc as I am discontinuing triamterene/HCTZ  - I have given the scripts of Norvasc and salt tabs to the patient. No need for cardizem.  Disposition: per primary service, okay from medical standpoint for discharge.    LOS: 4 days   Venora Kautzman M.D. [facility] 08/25/2011, 1:50 PM Pager: [phone]

## 2011-08-25 NOTE — Discharge Instructions (Signed)
Fluid restriction 1200cc/24hours (includes all liquids, juice, coffee/milk) Salt tabs 1gm twice a day Please follow up with your PCP on Friday, 08/28/11 for BMET (sodium level). If Na above 135, please stop the salt tabs.

## 2011-08-25 NOTE — Progress Notes (Signed)
Dr. Isidoro Donning called to say she would not be able to round until after lunchtime.  She has put new orders in chart for discharge.  Pt needs to follow up with PCP on Friday and get BMET drawn.  Pt is ambulating and without any other issues.  Per Dr. Isidoro Donning from her standpoint patient is ready for discharge. Victoria Rodgers

## 2011-08-25 NOTE — Progress Notes (Signed)
Orthopedic Tech Progress Note Patient Details:  Victoria Rodgers November 23, 1927 [id number]  Other Ortho Devices Type of Ortho Device: [place] cervical collar Ortho Device Interventions: Application   Cammer, Mickie Bail 08/25/2011, 11:59 AM

## 2011-08-25 NOTE — Discharge Summary (Signed)
Physician Discharge Summary  Patient ID: Victoria Rodgers MRN: [id number] DOB/AGE: [birth year] 78 y.o.  Admit date: 08/21/2011 Discharge date: 08/25/2011  Admission Diagnoses: kyphosis cervical spondylosis cervical degenerative disc disease cervical radiculopathy C4/5, C5/6, C6/7   Discharge Diagnoses: kyphosis cervical spondylosis cervical degenerative disc disease cervical radiculopathy C4/5, C5/6, C6/7 s/p ANTERIOR CERVICAL DECOMPRESSION/DISCECTOMY FUSION 3 LEVELS    Active Problems:  * No active hospital problems. *    Discharged Condition: good  Hospital Course: [name] was admitted with a diagnosis of kyphosis cervical spondylosis cervical degenerative disc disease cervical radiculopathy C4/5, C5/6, C6/7 and underwent an uncomplicated three level anterior cervical decompression and fusion (C4-5,C5-6,C6-7). She recovered without incident and was transferred to 3000. Over the weekend, she experienced chest pressure and bilateral arm pain. She was transferred to telemetry for monitoring. Enzymes were normal as was EKG. She continued to improve symptomatically. Hyponatremia with Na of 121 was corrected by Dr. Isidoro Donning with adjustment of her PO meds.  She will be d/c'ed to home today with primary MD f/u this Friday.   Consults: Hospitalist Service, Dr. Isidoro Donning for Tx of hyponatremia  Significant Diagnostic Studies: radiology: X-Ray: intra-operative  Treatments: surgery: ANTERIOR CERVICAL DECOMPRESSION/DISCECTOMY FUSION C4-5,C5-6,C6-7  Discharge Exam: Blood pressure 164/84, pulse 77, temperature 97.6 F (36.4 C), temperature source Oral, resp. rate 18, height 5\' 7"  (1.702 m), weight 73.936 kg (163 lb), SpO2 96.00%. Alert, conversant, smiling. Reports no pain at present, but understands posterior neck & trapezius pain may recur for several weeks. good strength BUE. Incision with Dermabond. No erythema, swelling, or drainage. No dysphagia.  Pt verbalizes understanding of d/c instructions. She  will call the office to schedule f/u with Dr. Venetia Maxon in 2 weeks.  Disposition: 01-Home or Self Care   Medication List  As of 08/25/2011 11:35 AM   ASK your doctor about these medications         budesonide 3 MG 24 hr capsule   Commonly known as: ENTOCORT EC   Take 3 mg by mouth every morning.      citalopram 20 MG tablet   Commonly known as: CELEXA   Take 20 mg by mouth daily.      dicyclomine 10 MG capsule   Commonly known as: BENTYL   Take 10 mg by mouth daily. Also may take more if needed up to 4 per day for stomach spasm      esomeprazole 40 MG capsule   Commonly known as: NEXIUM   Take 40 mg by mouth daily before breakfast.      estrogens (conjugated) 0.3 MG tablet   Commonly known as: PREMARIN   Take 0.3 mg by mouth daily. Take daily for 21 days then do not take for 7 days.      levothyroxine 88 MCG tablet   Commonly known as: SYNTHROID, LEVOTHROID   Take 88 mcg by mouth daily.      olmesartan 40 MG tablet   Commonly known as: BENICAR   Take 40 mg by mouth daily.      triamterene-hydrochlorothiazide 37.5-25 MG per tablet   Commonly known as: MAXZIDE-25   Take 1 tablet by mouth daily.             Signed: Georgiann Cocker 08/25/2011, 11:35 AM

## 2011-08-25 NOTE — Progress Notes (Signed)
Occupational Therapy Treatment Patient Details Name: Victoria Rodgers MRN: [id number] DOB: 10/03/1927 Today's Date: 08/25/2011  OT Assessment/Plan OT Assessment/Plan OT Plan: Discharge plan remains appropriate Follow Up Recommendations: No OT follow up;Supervision - Intermittent Equipment Recommended: None recommended by OT OT Goals ADL Goals ADL Goal: Additional Goal #1 - Progress: Progressing toward goals  OT Treatment Precautions/Restrictions  Precautions Precaution Comments: pt with cervical precautions (no flexion/extension/side bending (lateral flexion) or rotation).  Cervical Brace: Hard collar Restrictions Weight Bearing Restrictions: No   ADL ADL Eating/Feeding Details (indicate cue type and reason): Re-educated pt on placing washcloth on collar while eating to keep it from getting unnecessarily soiled Lower Body Dressing: Performed;Modified independent (pt able to don/doff bil. socks with legs crossed) Where Assessed - Lower Body Dressing: Sitting, bed Ambulation Related to ADLs: Pt S with ambulation with one LOB to the Lt. Pt able to self-correct ADL Comments: RE-educated pt on removal and cleaning of [place] collar liners. Pt. donned/doffed collar in front of mirror with Min A. Pt states that PA, Arlys John, told her she could take [place] collar off to shower, but pt would still like to have [place] collar to shower in for support.  Mobility  Bed Mobility Supine to Sit: 7: Independent;HOB flat Sit to Supine: 7: Independent;HOB flat  End of Session OT - End of Session Equipment Utilized During Treatment: Gait belt;Cervical collar Activity Tolerance: Patient tolerated treatment well Patient left: in bed;with call bell in reach General Behavior During Session: [facility] for tasks performed Cognition: [facility] for tasks performed  Pastor Sgro  08/25/2011, 11:53 AM

## 2011-08-25 NOTE — Discharge Summary (Signed)
Discontinue Celexa and Maxzide.  Follow up with Primary Dr. On Friday with Basic Metabolic Panel (lab test).  Continue Cardizem at home.

## 2011-08-25 NOTE — Progress Notes (Signed)
Pt given discharge instructions, medication list, follow up appointments, when to call the doctor and s/sx infection.  Pt verbalized understanding. Thomas Hoff

## 2011-08-25 NOTE — Progress Notes (Signed)
Physical Therapy Treatment Patient Details Name: Victoria Rodgers MRN: [id number] DOB: Oct 26, 1927 Today's Date: 08/25/2011  PT Assessment/Plan  PT - Assessment/Plan Comments on Treatment Session: When cleared by Dr. Venetia Maxon, pt would benefit physical therapy in the outpatient setting for balance and gait training. Pt aware.  PT Plan: Discharge plan remains appropriate;Frequency remains appropriate Follow Up Recommendations: Outpatient PT Equipment Recommended: None recommended by PT PT Goals  Acute Rehab PT Goals PT Goal: Supine/Side to Sit - Progress: Met PT Goal: Sit to Supine/Side - Progress: Met PT Goal: Sit to Stand - Progress: Met PT Goal: Stand to Sit - Progress: Met PT Goal: Ambulate - Progress: Progressing toward goal  PT Treatment Precautions/Restrictions  Precautions Precautions: Other (comment) Precaution Comments: pt with cervical precautions (no flexion/extension/side bending (lateral flexion) or rotation).  Required Braces or Orthoses: Yes Cervical Brace: Hard collar Restrictions Weight Bearing Restrictions: No Mobility (including Balance) Bed Mobility Supine to Sit: 6: Modified independent (Device/Increase time);HOB elevated (Comment degrees) (30 degrees) Sit to Supine: 6: Modified independent (Device/Increase time) Transfers Sit to Stand: 6: Modified independent (Device/Increase time) Stand to Sit: 6: Modified independent (Device/Increase time) Ambulation/Gait Ambulation/Gait Assistance: 5: Supervision Ambulation/Gait Assistance Details (indicate cue type and reason): amb with no AD today; pt able to ambulate throughout room in tight spaces with supervision; ambulated approx 500 ft with no AD in the hall with no AD, decreased gait speed which pt reports is baseline as she is more cautious 2[place] her hip previously; see balance section for higher level balance actvities Ambulation Distance (Feet): 500 Feet Assistive  device: None  Dynamic Standing Balance Dynamic Standing - Balance Activities: Reaching for weighted objects Dynamic Standing - Comments: pt performing ADLs in standing at the sink, reaching for objects, turning in the room, weight shifting all with supervision, no LOB noted but pt with increased difficulty because of limitations with neck; cues for safe neck posture and precautions for spine protection High Level Balance High Level Balance Activites: Side stepping;Braiding;Backward walking;Direction changes;Turns;Sudden stops High Level Balance Comments: pt mingaurdA-supervision for all high level balance activities today, unable to do any head turns secondary to hard collar brace; more difficulty with braiding but that is to be expected; pt would likely do well using a cane at this point, reports she has one  Exercise    End of Session PT - End of Session Equipment Utilized During Treatment: Gait belt;Cervical collar Activity Tolerance: Patient tolerated treatment well Patient left: in bed;with call bell in reach Nurse Communication: Mobility status for transfers;Mobility status for ambulation General Behavior During Session: [facility] for tasks performed Cognition: [facility] for tasks performed  [facility] HELEN 08/25/2011, 1:45 PM

## 2011-08-26 ENCOUNTER — Encounter (HOSPITAL_COMMUNITY): Payer: Self-pay | Admitting: Neurosurgery

## 2011-09-10 ENCOUNTER — Ambulatory Visit: Payer: Self-pay | Admitting: Oncology

## 2011-09-10 LAB — CANCER CENTER HEMOGLOBIN: HGB: 8.9 g/dL — ABNORMAL LOW (ref 12.0–16.0)

## 2011-09-21 ENCOUNTER — Ambulatory Visit: Payer: Self-pay | Admitting: Oncology

## 2011-10-08 LAB — CANCER CENTER HEMOGLOBIN: HGB: 9.2 g/dL — ABNORMAL LOW (ref 12.0–16.0)

## 2011-10-21 ENCOUNTER — Ambulatory Visit: Payer: Self-pay | Admitting: Oncology

## 2011-11-21 ENCOUNTER — Ambulatory Visit: Payer: Self-pay | Admitting: Oncology

## 2011-12-07 LAB — CBC CANCER CENTER
Basophil #: 0.1 x10 3/mm (ref 0.0–0.1)
Basophil %: 0.8 %
Eosinophil %: 1.5 %
MCH: 30.4 pg (ref 26.0–34.0)
Neutrophil #: 4.1 x10 3/mm (ref 1.4–6.5)
RDW: 13.9 % (ref 11.5–14.5)
WBC: 5.9 x10 3/mm (ref 3.6–11.0)

## 2011-12-21 ENCOUNTER — Ambulatory Visit: Payer: Self-pay | Admitting: Oncology

## 2012-01-07 LAB — CANCER CENTER HEMOGLOBIN: HGB: 9.4 g/dL — ABNORMAL LOW (ref 12.0–16.0)

## 2012-01-21 ENCOUNTER — Ambulatory Visit: Payer: Self-pay

## 2012-01-21 ENCOUNTER — Ambulatory Visit: Payer: Self-pay | Admitting: Oncology

## 2012-02-04 LAB — CANCER CENTER HEMOGLOBIN: HGB: 9.5 g/dL — ABNORMAL LOW (ref 12.0–16.0)

## 2012-02-21 ENCOUNTER — Ambulatory Visit: Payer: Self-pay | Admitting: Oncology

## 2012-03-22 ENCOUNTER — Ambulatory Visit: Payer: Self-pay | Admitting: Oncology

## 2012-03-31 LAB — CANCER CENTER HEMOGLOBIN: HGB: 10.5 g/dL — ABNORMAL LOW (ref 12.0–16.0)

## 2012-04-22 ENCOUNTER — Ambulatory Visit: Payer: Self-pay | Admitting: Oncology

## 2012-04-28 ENCOUNTER — Ambulatory Visit: Payer: Self-pay | Admitting: Oncology

## 2012-04-28 LAB — CANCER CENTER HEMOGLOBIN: HGB: 9.5 g/dL — ABNORMAL LOW (ref 12.0–16.0)

## 2012-05-20 ENCOUNTER — Ambulatory Visit: Payer: Self-pay | Admitting: Sports Medicine

## 2012-06-02 ENCOUNTER — Ambulatory Visit: Payer: Self-pay | Admitting: Oncology

## 2012-06-02 LAB — CANCER CENTER HEMOGLOBIN: HGB: 9.5 g/dL — ABNORMAL LOW (ref 12.0–16.0)

## 2012-06-20 ENCOUNTER — Ambulatory Visit: Payer: Self-pay | Admitting: Family Medicine

## 2012-06-22 ENCOUNTER — Ambulatory Visit: Payer: Self-pay | Admitting: Oncology

## 2012-07-21 LAB — CANCER CENTER HEMOGLOBIN: HGB: 9.9 g/dL — ABNORMAL LOW (ref 12.0–16.0)

## 2012-07-23 ENCOUNTER — Ambulatory Visit: Payer: Self-pay | Admitting: Oncology

## 2012-07-25 ENCOUNTER — Other Ambulatory Visit: Payer: Self-pay | Admitting: Orthopedic Surgery

## 2012-07-25 MED ORDER — DEXAMETHASONE SODIUM PHOSPHATE 10 MG/ML IJ SOLN: 10.0000 mg | Freq: Once | INTRAMUSCULAR | Status: DC

## 2012-07-25 NOTE — Progress Notes (Signed)
Preoperative surgical orders have been place into the Epic hospital system for Victoria Rodgers on 07/25/2012, 7:16 AM  by Patrica Duel for surgery on 08/03/2012.  Preop Knee Scope orders including IV Tylenol and IV Decadron as long as there are no contraindications to the above medications. Avel Peace, PA-C

## 2012-08-02 ENCOUNTER — Encounter (HOSPITAL_BASED_OUTPATIENT_CLINIC_OR_DEPARTMENT_OTHER): Payer: Self-pay | Admitting: *Deleted

## 2012-08-02 NOTE — Progress Notes (Signed)
NPO AFTER MN. ARRIVES AT 0830. NEEDS ISTAT. CURRENT EKG IN EPIC AND CHART. WILL TAKE TYLENOL, PREMARIN, NEXIUM, AND SYNTHROID AM OF SURG W/ SIPS OF WATER.

## 2012-08-03 ENCOUNTER — Encounter (HOSPITAL_BASED_OUTPATIENT_CLINIC_OR_DEPARTMENT_OTHER): Payer: Self-pay | Admitting: *Deleted

## 2012-08-03 ENCOUNTER — Ambulatory Visit (HOSPITAL_BASED_OUTPATIENT_CLINIC_OR_DEPARTMENT_OTHER)
Admission: RE | Admit: 2012-08-03 | Discharge: 2012-08-03 | Disposition: A | Discharge status: Home / Self Care | Payer: Medicare Other | Source: Ambulatory Visit | Attending: Orthopedic Surgery | Admitting: Orthopedic Surgery

## 2012-08-03 ENCOUNTER — Encounter (HOSPITAL_BASED_OUTPATIENT_CLINIC_OR_DEPARTMENT_OTHER)
Admission: RE | Disposition: A | Discharge status: Home / Self Care | Payer: Self-pay | Source: Ambulatory Visit | Attending: Orthopedic Surgery

## 2012-08-03 ENCOUNTER — Encounter (HOSPITAL_BASED_OUTPATIENT_CLINIC_OR_DEPARTMENT_OTHER): Payer: Self-pay | Admitting: Anesthesiology

## 2012-08-03 ENCOUNTER — Ambulatory Visit (HOSPITAL_BASED_OUTPATIENT_CLINIC_OR_DEPARTMENT_OTHER): Payer: Medicare Other | Admitting: Anesthesiology

## 2012-08-03 DIAGNOSIS — IMO0002 Reserved for concepts with insufficient information to code with codable children: Secondary | ICD-10-CM | POA: Insufficient documentation

## 2012-08-03 DIAGNOSIS — M810 Age-related osteoporosis without current pathological fracture: Secondary | ICD-10-CM | POA: Insufficient documentation

## 2012-08-03 DIAGNOSIS — D631 Anemia in chronic kidney disease: Secondary | ICD-10-CM | POA: Insufficient documentation

## 2012-08-03 DIAGNOSIS — I10 Essential (primary) hypertension: Secondary | ICD-10-CM | POA: Insufficient documentation

## 2012-08-03 DIAGNOSIS — X58XXXA Exposure to other specified factors, initial encounter: Secondary | ICD-10-CM | POA: Insufficient documentation

## 2012-08-03 DIAGNOSIS — S83289A Other tear of lateral meniscus, current injury, unspecified knee, initial encounter: Secondary | ICD-10-CM | POA: Insufficient documentation

## 2012-08-03 DIAGNOSIS — K219 Gastro-esophageal reflux disease without esophagitis: Secondary | ICD-10-CM | POA: Insufficient documentation

## 2012-08-03 DIAGNOSIS — Z79899 Other long term (current) drug therapy: Secondary | ICD-10-CM | POA: Insufficient documentation

## 2012-08-03 HISTORY — DX: Noninfective gastroenteritis and colitis, unspecified: K52.9

## 2012-08-03 HISTORY — DX: Chronic kidney disease, unspecified: D63.1

## 2012-08-03 HISTORY — DX: Vitamin B12 deficiency anemia, unspecified: D51.9

## 2012-08-03 HISTORY — DX: Chronic kidney disease, unspecified: N18.9

## 2012-08-03 HISTORY — DX: Iron deficiency anemia, unspecified: D50.9

## 2012-08-03 HISTORY — DX: Age-related osteoporosis without current pathological fracture: M81.0

## 2012-08-03 HISTORY — PX: KNEE ARTHROSCOPY: SHX127

## 2012-08-03 HISTORY — DX: Other chronic cystitis without hematuria: N30.20

## 2012-08-03 HISTORY — DX: Spinal stenosis, site unspecified: M48.00

## 2012-08-03 LAB — POCT I-STAT 4, (NA,K, GLUC, HGB,HCT)
Glucose, Bld: 95 mg/dL (ref 70–99)
Hemoglobin: 11.9 g/dL — ABNORMAL LOW (ref 12.0–15.0)
Potassium: 4.4 mEq/L (ref 3.5–5.1)

## 2012-08-03 SURGERY — ARTHROSCOPY, KNEE
Anesthesia: General | Site: Knee | Laterality: Right | Wound class: Clean

## 2012-08-03 MED ORDER — ACETAMINOPHEN 10 MG/ML IV SOLN
1000.0000 mg | Freq: Once | INTRAVENOUS | Status: AC
  Administered 2012-08-03: 1000 mg via INTRAVENOUS
  Filled 2012-08-03: qty 100

## 2012-08-03 MED ORDER — OXYCODONE HCL 5 MG/5ML PO SOLN
5.0000 mg | Freq: Once | ORAL | Status: AC | PRN
  Filled 2012-08-03: qty 5

## 2012-08-03 MED ORDER — MEPERIDINE HCL 25 MG/ML IJ SOLN
6.2500 mg | INTRAMUSCULAR | Status: DC | PRN
  Filled 2012-08-03: qty 1

## 2012-08-03 MED ORDER — LACTATED RINGERS IV SOLN
INTRAVENOUS | Status: DC
  Administered 2012-08-03 (×2): via INTRAVENOUS
  Filled 2012-08-03: qty 1000

## 2012-08-03 MED ORDER — CHLORHEXIDINE GLUCONATE 4 % EX LIQD
60.0000 mL | Freq: Once | CUTANEOUS | Status: DC
  Filled 2012-08-03: qty 60

## 2012-08-03 MED ORDER — ACETAMINOPHEN 10 MG/ML IV SOLN
1000.0000 mg | Freq: Once | INTRAVENOUS | Status: DC | PRN
  Filled 2012-08-03: qty 100

## 2012-08-03 MED ORDER — LIDOCAINE HCL (CARDIAC) 20 MG/ML IV SOLN
INTRAVENOUS | Status: DC | PRN
  Administered 2012-08-03: 80 mg via INTRAVENOUS

## 2012-08-03 MED ORDER — PROMETHAZINE HCL 25 MG/ML IJ SOLN
6.2500 mg | INTRAMUSCULAR | Status: DC | PRN
  Administered 2012-08-03: 6.25 mg via INTRAVENOUS
  Filled 2012-08-03: qty 1

## 2012-08-03 MED ORDER — ONDANSETRON HCL 4 MG/2ML IJ SOLN
INTRAMUSCULAR | Status: DC | PRN
  Administered 2012-08-03: 4 mg via INTRAVENOUS

## 2012-08-03 MED ORDER — BUPIVACAINE-EPINEPHRINE 0.25% -1:200000 IJ SOLN
INTRAMUSCULAR | Status: DC | PRN
  Administered 2012-08-03: 20 mL

## 2012-08-03 MED ORDER — HYDRALAZINE HCL 20 MG/ML IJ SOLN
5.0000 mg | INTRAMUSCULAR | Status: DC | PRN
  Administered 2012-08-03: 20 mg via INTRAVENOUS
  Filled 2012-08-03: qty 0.25

## 2012-08-03 MED ORDER — OXYCODONE HCL 5 MG PO TABS
5.0000 mg | ORAL_TABLET | Freq: Once | ORAL | Status: AC | PRN
  Administered 2012-08-03: 5 mg via ORAL
  Filled 2012-08-03: qty 1

## 2012-08-03 MED ORDER — DEXTROSE 5 % IV SOLN
3.0000 g | INTRAVENOUS | Status: DC
  Filled 2012-08-03: qty 3000

## 2012-08-03 MED ORDER — HYDRALAZINE HCL 20 MG/ML IJ SOLN
5.0000 mg | INTRAMUSCULAR | Status: AC | PRN
  Administered 2012-08-03 (×2): 20 mg via INTRAVENOUS
  Filled 2012-08-03: qty 0.25

## 2012-08-03 MED ORDER — DEXAMETHASONE SODIUM PHOSPHATE 4 MG/ML IJ SOLN
INTRAMUSCULAR | Status: DC | PRN
  Administered 2012-08-03: 10 mg via INTRAVENOUS

## 2012-08-03 MED ORDER — AMLODIPINE BESYLATE 10 MG PO TABS
10.0000 mg | ORAL_TABLET | Freq: Every day | ORAL | Status: DC
  Filled 2012-08-03: qty 1

## 2012-08-03 MED ORDER — FENTANYL CITRATE 0.05 MG/ML IJ SOLN
INTRAMUSCULAR | Status: DC | PRN
  Administered 2012-08-03 (×2): 50 ug via INTRAVENOUS

## 2012-08-03 MED ORDER — CEFAZOLIN SODIUM-DEXTROSE 2-3 GM-% IV SOLR
2.0000 g | Freq: Once | INTRAVENOUS | Status: AC
  Administered 2012-08-03: 2 g via INTRAVENOUS
  Filled 2012-08-03: qty 50

## 2012-08-03 MED ORDER — SODIUM CHLORIDE 0.9 % IV SOLN
INTRAVENOUS | Status: DC
  Filled 2012-08-03: qty 1000

## 2012-08-03 MED ORDER — PROPOFOL 10 MG/ML IV BOLUS
INTRAVENOUS | Status: DC | PRN
  Administered 2012-08-03: 30 mg via INTRAVENOUS
  Administered 2012-08-03: 150 mg via INTRAVENOUS

## 2012-08-03 MED ORDER — KETOROLAC TROMETHAMINE 30 MG/ML IJ SOLN
INTRAMUSCULAR | Status: DC | PRN
  Administered 2012-08-03: 15 mg via INTRAVENOUS

## 2012-08-03 MED ORDER — HYDROMORPHONE HCL PF 1 MG/ML IJ SOLN
0.2500 mg | INTRAMUSCULAR | Status: DC | PRN
  Filled 2012-08-03: qty 1

## 2012-08-03 MED ORDER — SODIUM CHLORIDE 0.9 % IR SOLN
Status: DC | PRN
  Administered 2012-08-03: 9000 mL

## 2012-08-03 MED ORDER — OXYCODONE-ACETAMINOPHEN 5-325 MG PO TABS: 1.0000 | ORAL_TABLET | ORAL | Status: DC | PRN

## 2012-08-03 MED ORDER — METHOCARBAMOL 500 MG PO TABS: 500.0000 mg | ORAL_TABLET | Freq: Four times a day (QID) | ORAL | Status: DC

## 2012-08-03 SURGICAL SUPPLY — 33 items
BANDAGE ELASTIC 6 VELCRO ST LF (GAUZE/BANDAGES/DRESSINGS) ×2 IMPLANT
BLADE 4.2CUDA (BLADE) ×2 IMPLANT
BLADE CUDA SHAVER 3.5 (BLADE) IMPLANT
BLADE CUTTER GATOR 3.5 (BLADE) IMPLANT
CANISTER SUCT LVC 12 LTR MEDI- (MISCELLANEOUS) ×2 IMPLANT
CANISTER SUCTION 2500CC (MISCELLANEOUS) IMPLANT
CLOTH BEACON ORANGE TIMEOUT ST (SAFETY) ×2 IMPLANT
DRAPE ARTHROSCOPY W/POUCH 114 (DRAPES) ×2 IMPLANT
DRSG EMULSION OIL 3X3 NADH (GAUZE/BANDAGES/DRESSINGS) ×2 IMPLANT
DRSG PAD ABDOMINAL 8X10 ST (GAUZE/BANDAGES/DRESSINGS) ×1 IMPLANT
DURAPREP 26ML APPLICATOR (WOUND CARE) ×2 IMPLANT
ELECT MENISCUS 165MM 90D (ELECTRODE) IMPLANT
ELECT REM PT RETURN 9FT ADLT (ELECTROSURGICAL)
ELECTRODE REM PT RTRN 9FT ADLT (ELECTROSURGICAL) IMPLANT
GLOVE BIO SURGEON STRL SZ8 (GLOVE) ×2 IMPLANT
GLOVE INDICATOR 8.0 STRL GRN (GLOVE) ×2 IMPLANT
GOWN PREVENTION PLUS LG XLONG (DISPOSABLE) ×4 IMPLANT
IV NS IRRIG 3000ML ARTHROMATIC (IV SOLUTION) ×4 IMPLANT
KNEE WRAP E Z 3 GEL PACK (MISCELLANEOUS) ×2 IMPLANT
PACK ARTHROSCOPY DSU (CUSTOM PROCEDURE TRAY) ×2 IMPLANT
PACK BASIN DAY SURGERY FS (CUSTOM PROCEDURE TRAY) ×2 IMPLANT
PADDING CAST ABS 4INX4YD NS (CAST SUPPLIES) ×1
PADDING CAST ABS COTTON 4X4 ST (CAST SUPPLIES) ×1 IMPLANT
PADDING CAST COTTON 6X4 STRL (CAST SUPPLIES) ×2 IMPLANT
PENCIL BUTTON HOLSTER BLD 10FT (ELECTRODE) IMPLANT
SET ARTHROSCOPY TUBING (MISCELLANEOUS) ×2
SET ARTHROSCOPY TUBING LN (MISCELLANEOUS) ×1 IMPLANT
SPONGE GAUZE 4X4 12PLY (GAUZE/BANDAGES/DRESSINGS) ×2 IMPLANT
SUT ETHILON 4 0 PS 2 18 (SUTURE) ×2 IMPLANT
TOWEL OR 17X24 6PK STRL BLUE (TOWEL DISPOSABLE) ×4 IMPLANT
WAND 30 DEG SABER W/CORD (SURGICAL WAND) IMPLANT
WAND 90 DEG TURBOVAC W/CORD (SURGICAL WAND) ×1 IMPLANT
WATER STERILE IRR 500ML POUR (IV SOLUTION) ×2 IMPLANT

## 2012-08-03 NOTE — Interval H&P Note (Signed)
History and Physical Interval Note:  08/03/2012 10:21 AM  Victoria Rodgers  has presented today for surgery, with the diagnosis of right knee lateral meniscal tear   The various methods of treatment have been discussed with the patient and family. After consideration of risks, benefits and other options for treatment, the patient has consented to  Procedure(s) with comments: ARTHROSCOPY KNEE (Right) - with debridement  as a surgical intervention .  The patient's history has been reviewed, patient examined, no change in status, stable for surgery.  I have reviewed the patient's chart and labs.  Questions were answered to the patient's satisfaction.     Loanne Drilling

## 2012-08-03 NOTE — Anesthesia Postprocedure Evaluation (Signed)
Anesthesia Post Note  Patient: Victoria Rodgers  Procedure(s) Performed: Procedure(s) (LRB): ARTHROSCOPY KNEE (Right)  Anesthesia type: General  Patient location: PACU  Post pain: Pain level controlled  Post assessment: Post-op Vital signs reviewed  Last Vitals: BP 178/67  Pulse 69  Temp(Src) 35.9 C (Oral)  Resp 10  Ht 5\' 7"  (1.702 m)  Wt 170 lb 5 oz (77.253 kg)  BMI 26.67 kg/m2  SpO2 100%  Post vital signs: Reviewed  Level of consciousness: sedated  Complications: No apparent anesthesia complications

## 2012-08-03 NOTE — H&P (Signed)
CC- Victoria Rodgers is a 79 y.o. female who presents with right knee pain.  HPI- . Knee Pain: Patient presents with knee pain involving the  right knee. Onset of the symptoms was several months ago. Inciting event: twisted while getting up from sitting position. Current symptoms include giving out, pain located laterally and stiffness. Pain is aggravated by kneeling, pivoting, rising after sitting, squatting and walking.  Patient has had no prior knee problems. Evaluation to date: MRI: abnormal lateral meniscal tear. Treatment to date: rest.  Past Medical History  Diagnosis Date  . Arthritis   . Bruises easily   . GERD (gastroesophageal reflux disease)     takes Nexium daily  . Anxiety     takes Celexa daily  . Hypertension     takes Benicar and Maxzide daily  . Hypothyroidism     takes Synthroid daily  . Acute meniscal tear of knee RIGHT  . SS (spinal stenosis)   . Chronic colitis   . Chronic cystitis   . Iron deficiency anemia SECONDARY TO KIDNEY DISEASE--  FOLLOWED BY DR Hamilton Capri INJECTION EVERY 3 MONTHS  . Anemia in chronic kidney disease   . B12 deficiency anemia   . Osteoporosis     Past Surgical History  Procedure Laterality Date  . Cesarean section    . Total knee arthroplasty  2007    left knee  . Total hip arthroplasty  2010    SEVERE RIGHT HIP FX  . Anterior cervical decomp/discectomy fusion  08/21/2011    Procedure: ANTERIOR CERVICAL DECOMPRESSION/DISCECTOMY FUSION 3 LEVELS;  Surgeon: Dorian Heckle, MD;  Location: [facility];  Service: Neurosurgery;  Laterality: N/A;  Cervical four-five Cervcial five-six Cervical six-seven anterior cervical decompression with fusion interbody prothesis plating and bonegraft  . Cataract extraction w/ intraocular lens  implant, bilateral    . Back surgery  X3  LAST ONE JAN. 2013    LUMBAR DISKECTOMY  . Appendectomy  AGE 89'S  . Vaginal hysterectomy  AGE 57  . Hammer toe surgery      LEFT FOOT X4 TOES    Prior  to Admission medications   Medication Sig Start Date End Date Taking? Authorizing Provider  acetaminophen (TYLENOL) 650 MG CR tablet Take 650 mg by mouth 4 (four) times daily - after meals and at bedtime.   Yes Historical Provider, MD  alendronate (FOSAMAX) 70 MG tablet Take 70 mg by mouth every 7 (seven) days. Take with a full glass of water on an empty stomach.   Yes Historical Provider, MD  Calcium Carbonate-Vitamin D (CALCIUM 600 + D PO) Take by mouth.   Yes Historical Provider, MD  Cyanocobalamin (B-12 PO) Take 6,000 mcg by mouth daily.   Yes Historical Provider, MD  Darbepoetin Alfa-Albumin (ARANESP IJ) Inject as directed every 30 (thirty) days.   Yes Historical Provider, MD  escitalopram (LEXAPRO) 10 MG tablet Take 10 mg by mouth every morning.   Yes Historical Provider, MD  esomeprazole (NEXIUM) 40 MG capsule Take 40 mg by mouth 2 (two) times daily.    Yes Historical Provider, MD  estrogens, conjugated, (PREMARIN) 0.3 MG tablet Take 0.3 mg by mouth every morning.    Yes Historical Provider, MD  levothyroxine (SYNTHROID, LEVOTHROID) 88 MCG tablet Take 88 mcg by mouth every morning.    Yes Historical Provider, MD  olmesartan (BENICAR) 40 MG tablet Take 40 mg by mouth every morning.    Yes Historical Provider, MD  triamterene-hydrochlorothiazide Ilsa Iha)  37.5-25 MG per capsule Take 0.25 capsules by mouth every morning.   Yes Historical Provider, MD  Vitamin D, Ergocalciferol, (DRISDOL) 50000 UNITS CAPS Take 50,000 Units by mouth every 7 (seven) days.   Yes Historical Provider, MD   KNEE EXAM antalgic gait, soft tissue tenderness over lateral joint line, collateral ligaments intact, negative Lachman sign, normal ipsilateral hip exam  Physical Examination: General appearance - alert, well appearing, and in no distress Mental status - alert, oriented to person, place, and time Chest - clear to auscultation, no wheezes, rales or rhonchi, symmetric air entry Heart - normal rate, regular rhythm,  normal S1, S2, no murmurs, rubs, clicks or gallops Abdomen - soft, nontender, nondistended, no masses or organomegaly Neurological - alert, oriented, normal speech, no focal findings or movement disorder noted   Asessment/Plan--- Right knee lateral meniscal tear- - Plan rightt knee arthroscopy with meniscal debridement. Procedure risks and potential comps discussed with patient who elects to proceed. Goals are decreased pain and increased function with a high likelihood of achieving both

## 2012-08-03 NOTE — Op Note (Signed)
Preoperative diagnosis-  Right knee lateral meniscal tear  Postoperative diagnosis Right- knee lateral meniscal tear Plus Right medial meniscal tear  Procedure- Right knee arthroscopy with medial and lateral Meniscal debridement    Surgeon- Gus Rankin. Takhia Spoon, MD  Anesthesia-General  EBL-  minimal Complications- None  Condition- PACU - hemodynamically stable.  Brief clinical note- -Victoria Rodgers is a 79 y.o.  female with a several month history of right knee pain and mechanical symptoms. Exam and history suggested lateral meniscal tear confirmed by MRI. The patient presents now for arthroscopy and debridement   Procedure in detail -       After successful administration of General anesthetic, a tourmiquet is placed high on the Right  thigh and the Right lower extremity is prepped and draped in the usual sterile fashion. Time out is performed by the surgical team. Standard superomedial and inferolateral portal sites are marked and incisions made with an 11 blade. The inflow cannula is passed through the superomedial portal and camera through the inferolateral portal and inflow is initiated. Arthroscopic visualization proceeds.      The undersurface of the patella and trochlea are visualized and there is grade II chondromalacia with no focal chondral defects.. The medial and lateral gutters are visualized and there are  no loose bodies. Flexion and valgus force is applied to the knee and the medial compartment is entered. A spinal needle is passed into the joint through the site marked for the inferomedial portal. A small incision is made and the dilator passed into the joint. The findings for the medial compartment are tear of posterior horn of medial meniscus with grade II focal changes on medial femoral condyle . The tear is debrided to a stable base with baskets and a shaver and sealed off with the Arthrocare. It is probed and found to be stable.    The intercondylar notch is visualized  and the ACL appears normal. The lateral compartment is entered and the findings are diffuse grade II chondromalacia and a tear in the body and anterior horn of the lateral meniscus. The tear is debrided to a stable base with baskets and a shaver and sealed off with the Arthrocare. able edges.     The joint is again inspected and there are no other tears, defects or loose bodies identified. The arthroscopic equipment is then removed from the inferior portals which are closed with interrupted 4-0 nylon. 20 ml of .25% Marcaine with epinephrine are injected through the inflow cannula and the cannula is then removed and the portal closed with nylon. The incisions are cleaned and dried and a bulky sterile dressing is applied. The patient is then awakened and transported to recovery in stable condition.   08/03/2012, 11:01 AM

## 2012-08-03 NOTE — Transfer of Care (Signed)
Immediate Anesthesia Transfer of Care Note  Patient: Victoria Rodgers  Procedure(s) Performed: Procedure(s) with comments: ARTHROSCOPY KNEE (Right) - with meniscal  debridement   Patient Location: PACU  Anesthesia Type:General  Level of Consciousness: awake and oriented  Airway & Oxygen Therapy: Patient Spontanous Breathing and Patient connected to nasal cannula oxygen  Post-op Assessment: Report given to PACU RN  Post vital signs: Reviewed and stable  Complications: No apparent anesthesia complications

## 2012-08-03 NOTE — Anesthesia Procedure Notes (Signed)
Procedure Name: LMA Insertion Date/Time: 08/03/2012 10:29 AM Performed by: Maris Berger T Pre-anesthesia Checklist: Patient identified, Emergency Drugs available, Suction available and Patient being monitored Patient Re-evaluated:Patient Re-evaluated prior to inductionOxygen Delivery Method: Circle System Utilized Preoxygenation: Pre-oxygenation with 100% oxygen Intubation Type: IV induction Ventilation: Mask ventilation without difficulty LMA: LMA inserted LMA Size: 4.0 Number of attempts: 1 Placement Confirmation: positive ETCO2 Dental Injury: Teeth and Oropharynx as per pre-operative assessment  Comments: Gauze roll between teeth

## 2012-08-03 NOTE — Progress Notes (Addendum)
Bp remains > 170.  denies pain . Just achy still.  Dr. Renold Don paged via beeper.

## 2012-08-03 NOTE — Progress Notes (Signed)
Pt 's bp elevated. Since arrival to pacu.  Hr stable.  Pt denies pain, just achy.  Dr. Renold Don informed.

## 2012-08-03 NOTE — Anesthesia Preprocedure Evaluation (Signed)
Anesthesia Evaluation  Patient identified by MRN, date of birth, ID band Patient awake    Reviewed: Allergy & Precautions, H&P , NPO status , Patient's Chart, lab work & pertinent test results  Airway Mallampati: II TM Distance: >3 FB Neck ROM: Full    Dental  (+) Dental Advisory Given   Pulmonary former smoker,  breath sounds clear to auscultation  Pulmonary exam normal       Cardiovascular hypertension, Pt. on medications Rhythm:Regular Rate:Normal     Neuro/Psych PSYCHIATRIC DISORDERS Anxiety negative neurological ROS     GI/Hepatic Neg liver ROS, GERD-  Controlled and Medicated,  Endo/Other  Hypothyroidism   Renal/GU negative Renal ROSMild CRF baseline Cr 1.61     Musculoskeletal  (+) Arthritis -, Osteoarthritis,    Abdominal   Peds  Hematology  (+) Blood dyscrasia, anemia ,   Anesthesia Other Findings   Reproductive/Obstetrics                           Anesthesia Physical  Anesthesia Plan  ASA: II  Anesthesia Plan: General   Post-op Pain Management:    Induction: Intravenous  Airway Management Planned: LMA  Additional Equipment:   Intra-op Plan:   Post-operative Plan: Extubation in OR  Informed Consent: I have reviewed the patients History and Physical, chart, labs and discussed the procedure including the risks, benefits and alternatives for the proposed anesthesia with the patient or authorized representative who has indicated his/her understanding and acceptance.   Dental advisory given  Plan Discussed with: CRNA  Anesthesia Plan Comments:         Anesthesia Quick Evaluation

## 2012-08-05 ENCOUNTER — Encounter (HOSPITAL_BASED_OUTPATIENT_CLINIC_OR_DEPARTMENT_OTHER): Payer: Self-pay | Admitting: Orthopedic Surgery

## 2012-08-19 LAB — IRON AND TIBC
Iron Saturation: 18 %
Iron: 55 ug/dL (ref 50–170)
Unbound Iron-Bind.Cap.: 249 ug/dL

## 2012-08-20 ENCOUNTER — Ambulatory Visit: Payer: Self-pay | Admitting: Oncology

## 2012-09-15 LAB — CANCER CENTER HEMOGLOBIN: HGB: 10.2 g/dL — ABNORMAL LOW (ref 12.0–16.0)

## 2012-09-20 ENCOUNTER — Ambulatory Visit: Payer: Self-pay | Admitting: Oncology

## 2012-10-21 ENCOUNTER — Ambulatory Visit: Payer: Self-pay | Admitting: Oncology

## 2012-10-24 LAB — CANCER CENTER HEMOGLOBIN: HGB: 9.9 g/dL — ABNORMAL LOW (ref 12.0–16.0)

## 2012-11-16 ENCOUNTER — Other Ambulatory Visit: Payer: Self-pay | Admitting: Dermatology

## 2012-11-20 ENCOUNTER — Ambulatory Visit: Payer: Self-pay | Admitting: Oncology

## 2012-12-20 ENCOUNTER — Ambulatory Visit: Payer: Self-pay | Admitting: Oncology

## 2013-01-20 ENCOUNTER — Ambulatory Visit: Payer: Self-pay | Admitting: Oncology

## 2013-02-21 ENCOUNTER — Encounter (HOSPITAL_COMMUNITY): Payer: Self-pay | Admitting: Pharmacy Technician

## 2013-02-21 ENCOUNTER — Other Ambulatory Visit: Payer: Self-pay | Admitting: Orthopedic Surgery

## 2013-02-27 ENCOUNTER — Ambulatory Visit: Payer: Self-pay | Admitting: Oncology

## 2013-02-27 LAB — CANCER CENTER HEMOGLOBIN: HGB: 9.8 g/dL — ABNORMAL LOW (ref 12.0–16.0)

## 2013-02-28 ENCOUNTER — Other Ambulatory Visit: Payer: Self-pay | Admitting: Orthopedic Surgery

## 2013-02-28 ENCOUNTER — Encounter (HOSPITAL_COMMUNITY): Payer: Self-pay

## 2013-02-28 ENCOUNTER — Encounter (HOSPITAL_COMMUNITY)
Admission: RE | Admit: 2013-02-28 | Discharge: 2013-02-28 | Disposition: A | Discharge status: Home / Self Care | Payer: Medicare Other | Source: Ambulatory Visit | Attending: Orthopedic Surgery | Admitting: Orthopedic Surgery

## 2013-02-28 ENCOUNTER — Ambulatory Visit (HOSPITAL_COMMUNITY)
Admission: RE | Admit: 2013-02-28 | Discharge: 2013-02-28 | Disposition: A | Discharge status: Home / Self Care | Payer: Medicare Other | Source: Ambulatory Visit | Attending: Orthopedic Surgery | Admitting: Orthopedic Surgery

## 2013-02-28 DIAGNOSIS — M1710 Unilateral primary osteoarthritis, unspecified knee: Secondary | ICD-10-CM | POA: Insufficient documentation

## 2013-02-28 DIAGNOSIS — I771 Stricture of artery: Secondary | ICD-10-CM | POA: Insufficient documentation

## 2013-02-28 DIAGNOSIS — Z01812 Encounter for preprocedural laboratory examination: Secondary | ICD-10-CM | POA: Insufficient documentation

## 2013-02-28 DIAGNOSIS — I10 Essential (primary) hypertension: Secondary | ICD-10-CM | POA: Insufficient documentation

## 2013-02-28 DIAGNOSIS — Z01818 Encounter for other preprocedural examination: Secondary | ICD-10-CM | POA: Insufficient documentation

## 2013-02-28 HISTORY — DX: Chronic kidney disease, unspecified: N18.9

## 2013-02-28 LAB — URINALYSIS, ROUTINE W REFLEX MICROSCOPIC
Bilirubin Urine: NEGATIVE
Hgb urine dipstick: NEGATIVE
Ketones, ur: NEGATIVE mg/dL
Protein, ur: NEGATIVE mg/dL
Specific Gravity, Urine: 1.025 (ref 1.005–1.030)
Urobilinogen, UA: 0.2 mg/dL (ref 0.0–1.0)

## 2013-02-28 LAB — COMPREHENSIVE METABOLIC PANEL
Albumin: 3.7 g/dL (ref 3.5–5.2)
BUN: 22 mg/dL (ref 6–23)
Calcium: 9.5 mg/dL (ref 8.4–10.5)
Creatinine, Ser: 1.37 mg/dL — ABNORMAL HIGH (ref 0.50–1.10)
GFR calc Af Amer: 40 mL/min — ABNORMAL LOW (ref >90)
Glucose, Bld: 94 mg/dL (ref 70–99)
Total Protein: 6.6 g/dL (ref 6.0–8.3)

## 2013-02-28 LAB — CBC
HCT: 29.9 % — ABNORMAL LOW (ref 36.0–46.0)
Hemoglobin: 9.5 g/dL — ABNORMAL LOW (ref 12.0–15.0)
MCH: 30 pg (ref 26.0–34.0)
MCHC: 31.8 g/dL (ref 30.0–36.0)
RDW: 14.2 % (ref 11.5–15.5)

## 2013-02-28 LAB — PROTIME-INR
INR: 1.02 (ref 0.00–1.49)
Prothrombin Time: 13.2 seconds (ref 11.6–15.2)

## 2013-02-28 LAB — APTT: aPTT: 26 seconds (ref 24–37)

## 2013-02-28 NOTE — Pre-Procedure Instructions (Signed)
CXR WAS DONE TODAY - PREOP - AT [facility]. EKG REPORT IS ON PT'S CHART FROM [facility] - IT WAS DONE 02/01/13

## 2013-02-28 NOTE — Patient Instructions (Signed)
YOUR SURGERY IS SCHEDULED AT [facility]  ON:  Monday  9/15  REPORT TO [facility] AT:  8:35 AM      PHONE # FOR SHORT STAY IS [phone]  DO NOT EAT OR DRINK ANYTHING AFTER MIDNIGHT THE NIGHT BEFORE YOUR SURGERY.  YOU MAY BRUSH YOUR TEETH, RINSE OUT YOUR MOUTH--BUT NO WATER, NO FOOD, NO CHEWING GUM, NO MINTS, NO CANDIES, NO CHEWING TOBACCO.  PLEASE TAKE THE FOLLOWING MEDICATIONS THE AM OF YOUR SURGERY WITH A FEW SIPS OF WATER:    ESCITALOPRAM, NEXIUM, LEVOTHYROXINE    DO NOT BRING VALUABLES, MONEY, CREDIT CARDS.  DO NOT WEAR JEWELRY, MAKE-UP, NAIL POLISH AND NO METAL PINS OR CLIPS IN YOUR HAIR. CONTACT LENS, DENTURES / PARTIALS, GLASSES SHOULD NOT BE WORN TO SURGERY AND IN MOST CASES-HEARING AIDS WILL NEED TO BE REMOVED.  BRING YOUR GLASSES CASE, ANY EQUIPMENT NEEDED FOR YOUR CONTACT LENS. FOR PATIENTS ADMITTED TO THE HOSPITAL--CHECK OUT TIME THE DAY OF DISCHARGE IS 11:00 AM.  ALL INPATIENT ROOMS ARE PRIVATE - WITH BATHROOM, TELEPHONE, TELEVISION AND WIFI INTERNET.                                PLEASE READ OVER ANY  FACT SHEETS THAT YOU WERE GIVEN: MRSA INFORMATION, BLOOD TRANSFUSION INFORMATION, INCENTIVE SPIROMETER INFORMATION. FAILURE TO FOLLOW THESE INSTRUCTIONS MAY RESULT IN THE CANCELLATION OF YOUR SURGERY.   PATIENT SIGNATURE_________________________________

## 2013-02-28 NOTE — Pre-Procedure Instructions (Signed)
PT'S HGB 9.5 - CBC REPORT FAXED TO DR. ALUISIO'S OFFICE.

## 2013-03-01 ENCOUNTER — Encounter (HOSPITAL_COMMUNITY): Payer: Self-pay

## 2013-03-01 NOTE — Pre-Procedure Instructions (Signed)
Office notes dr. Orlie Dakin - [facility] from may 2014 on pt's chart.

## 2013-03-05 ENCOUNTER — Other Ambulatory Visit: Payer: Self-pay | Admitting: Orthopedic Surgery

## 2013-03-05 NOTE — H&P (Signed)
Victoria Rodgers. Victoria Rodgers  DOB: [birth year] Married / Language: English / Race: White Female  Date of Admission:  03/06/2013  Chief Complaint:  Right Knee Pain  History of Present Illness The patient is a 79 year old female who comes in for a preoperative History and Physical. The patient is scheduled for a right total knee arthroplasty to be performed by Dr. Gus Rankin. Aluisio, MD at [facility] on 03/06/2013. The patient is a 79 year old female who presents for follow up of their knee. The patient is being followed for their right knee pain and osteoarthritis. They are now week(s) out from Euflexxa series (and almost 5 months out from knee arthroscopy). Symptoms reported today include: pain, aching, instability and difficulty ambulating. The patient feels that they are doing poorly and report their pain level to be moderate to severe. The patient has not gotten any relief of their symptoms with viscosupplementation.  Victoria Rodgers comes back in for follow up after the gel series. She states there has been no benefit from the injections. The knee feels like it did back prior to having her knee scope. It wants to buckle and catch with her sometimes but no swelling. Icing really has not helped much. Unfortunately the right knee is getting progressively worse. This is as bad as the left knee was prior to the time we replaced it. She does have advanced arthritis in the lateral compartment of that right knee. She is now ready to get her knee fixed. They have been treated conservatively in the past for the above stated problem and despite conservative measures, they continue to have progressive pain and severe functional limitations and dysfunction. They have failed non-operative management including home exercise, medications, and injections. It is felt that they would benefit from undergoing total joint replacement. Risks and benefits of the procedure have been discussed with the patient and  they elect to proceed with surgery. There are no active contraindications to surgery such as ongoing infection or rapidly progressive neurological disease.   Problem List Primary osteoarthritis of one knee (715.16)  Allergies Sulfa Drugs. Nausea.   Family History Osteoporosis. mother and brother Osteoarthritis. mother Cancer. mother, father and brother Cerebrovascular Accident. grandmother mothers side Hypertension. mother, grandmother mothers side and grandmother fathers side Heart Disease. mother, grandmother mothers side and grandmother fathers side   Social History Tobacco use. former smoker; smoke(d) 1/2 pack(s) per day Tobacco / smoke exposure. no Illicit drug use. no Exercise. Exercises daily; does other Living situation. live with spouse Pain Contract. no Marital status. married Drug/Alcohol Rehab (Previously). no Alcohol use. current drinker; drinks wine; only occasionally per week Children. 3 Drug/Alcohol Rehab (Currently). no Current work status. retired   Medication History Triamterene-HCTZ (37.5-25MG  Tablet, Oral) Active. Benicar (20MG  Tablet, Oral) Active. Levothyroxine Sodium ( Tablet, Oral) Active. Escitalopram Oxalate (10MG  Tablet, Oral) Active. NexIUM (40MG  Capsule DR, Oral) Active. Premarin (0.3MG  Tablet, Oral) Active. Caltrate 600+D (600-400MG -UNIT Tablet, Oral) Active. Acetaminophen (650MG  Tablet, Oral) Active.   Past Surgical History Appendectomy Cataract Surgery. bilateral Dilation and Curettage of Uterus - Multiple Cesarean Delivery. 1 time Total Knee Replacement. left Total Hip Replacement. right Hysterectomy. complete (non-cancerous) Foot Surgery. left Neck Disc Surgery Tonsillectomy Spinal Surgery  Medical History Anemia Anxiety Disorder Gastroesophageal Reflux Disease Chronic Cystitis Osteoporosis Hypercholesterolemia High blood pressure Hypothyroidism Osteoarthritis Irritable bowel  syndrome Bursitis Chronic fatigue syndrome   Review of Systems General:Not Present- Chills, Fever, Night Sweats, Fatigue, Weight Gain, Weight Loss and Memory Loss. Skin:Not Present- Hives, Itching, Rash,  Eczema and Lesions. HEENT:Not Present- Tinnitus, Headache, Double Vision, Visual Loss, Hearing Loss and Dentures. Respiratory:Not Present- Shortness of breath with exertion, Shortness of breath at rest, Allergies, Coughing up blood and Chronic Cough. Cardiovascular:Not Present- Chest Pain, Racing/skipping heartbeats, Difficulty Breathing Lying Down, Murmur, Swelling and Palpitations. Gastrointestinal:Not Present- Bloody Stool, Heartburn, Abdominal Pain, Vomiting, Nausea, Constipation, Diarrhea, Difficulty Swallowing, Jaundice and Loss of appetitie. Female Genitourinary:Not Present- Blood in Urine, Urinary frequency, Weak urinary stream, Discharge, Flank Pain, Incontinence, Painful Urination, Urgency, Urinary Retention and Urinating at Night. Musculoskeletal:Present- Joint Pain. Not Present- Muscle Weakness, Muscle Pain, Joint Swelling, Back Pain, Morning Stiffness and Spasms. Neurological:Not Present- Tremor, Dizziness, Blackout spells, Paralysis, Difficulty with balance and Weakness. Psychiatric:Not Present- Insomnia.   Vitals Weight: 165 lb Height: 67 in Weight was reported by patient. Height was reported by patient. Body Surface Area: 1.88 m Body Mass Index: 25.84 kg/m Pulse: 72 (Regular) Resp.: 16 (Unlabored) BP: 144/78 (Sitting, Left Arm, Standard)    Physical Exam The physical exam findings are as follows:   General Mental Status - Alert, cooperative and good historian. General Appearance- pleasant. Not in acute distress. Orientation- Oriented X3. Build & Nutrition- Well nourished and Well developed.   Head and Neck Head- normocephalic, atraumatic . Neck Global Assessment- supple. no bruit auscultated on the right and no bruit  auscultated on the left.   Eye Pupil- Bilateral- Regular and Round. Motion- Bilateral- EOMI.   Chest and Lung Exam Auscultation: Breath sounds:- clear at anterior chest wall and - clear at posterior chest wall. Adventitious sounds:- No Adventitious sounds.   Cardiovascular Auscultation:Rhythm- Regular rate and rhythm. Heart Sounds- S1 WNL and S2 WNL. Murmurs & Other Heart Sounds:Auscultation of the heart reveals - No Murmurs.   Abdomen Palpation/Percussion:Tenderness- Abdomen is non-tender to palpation. Rigidity (guarding)- Abdomen is soft. Auscultation:Auscultation of the abdomen reveals - Bowel sounds normal.   Female Genitourinary Not done, not pertinent to present illness  Musculoskeletal On exam she is alert and oriented in no apparent distress. Evaluation of her right knee shows slight valgus. There is no effusion. Range is about 5 to 125 with marked crepitus on range of motion. Tender lateral greater than medial with no instability.  Assessment & Plan Primary osteoarthritis of one knee (715.16) Impression: Right Knee  Pt Education - How to access health information online: discussed with patient and provided information.  Note: Plan is for a Right Total Knee Replacement by Dr. Lequita Halt.  Plan is to go to the [facility] at [facility] in [place]  PCP - Dr. Burnett Sheng  The patient does not have any contraindications and will recieve TXA (tranexamic acid) prior to surgery.  Signed electronically by Lauraine Rinne, III PA-C

## 2013-03-06 ENCOUNTER — Encounter (HOSPITAL_COMMUNITY): Payer: Self-pay | Admitting: Certified Registered Nurse Anesthetist

## 2013-03-06 ENCOUNTER — Encounter (HOSPITAL_COMMUNITY)
Admission: RE | Disposition: A | Discharge status: Skilled Nursing Facility | Payer: Self-pay | Source: Ambulatory Visit | Attending: Orthopedic Surgery

## 2013-03-06 ENCOUNTER — Encounter (HOSPITAL_COMMUNITY): Payer: Self-pay | Admitting: *Deleted

## 2013-03-06 ENCOUNTER — Inpatient Hospital Stay (HOSPITAL_COMMUNITY): Payer: Medicare Other | Admitting: Certified Registered Nurse Anesthetist

## 2013-03-06 ENCOUNTER — Inpatient Hospital Stay (HOSPITAL_COMMUNITY)
Admission: RE | Admit: 2013-03-06 | Discharge: 2013-03-09 | DRG: 470 | Disposition: A | Discharge status: Skilled Nursing Facility | Payer: Medicare Other | Source: Ambulatory Visit | Attending: Orthopedic Surgery | Admitting: Orthopedic Surgery

## 2013-03-06 DIAGNOSIS — D509 Iron deficiency anemia, unspecified: Secondary | ICD-10-CM | POA: Diagnosis present

## 2013-03-06 DIAGNOSIS — D631 Anemia in chronic kidney disease: Secondary | ICD-10-CM | POA: Diagnosis present

## 2013-03-06 DIAGNOSIS — E7800 Pure hypercholesterolemia, unspecified: Secondary | ICD-10-CM | POA: Diagnosis present

## 2013-03-06 DIAGNOSIS — I129 Hypertensive chronic kidney disease with stage 1 through stage 4 chronic kidney disease, or unspecified chronic kidney disease: Secondary | ICD-10-CM | POA: Diagnosis present

## 2013-03-06 DIAGNOSIS — D518 Other vitamin B12 deficiency anemias: Secondary | ICD-10-CM | POA: Diagnosis present

## 2013-03-06 DIAGNOSIS — E039 Hypothyroidism, unspecified: Secondary | ICD-10-CM | POA: Diagnosis present

## 2013-03-06 DIAGNOSIS — Z79899 Other long term (current) drug therapy: Secondary | ICD-10-CM

## 2013-03-06 DIAGNOSIS — M179 Osteoarthritis of knee, unspecified: Secondary | ICD-10-CM | POA: Diagnosis present

## 2013-03-06 DIAGNOSIS — M109 Gout, unspecified: Secondary | ICD-10-CM | POA: Diagnosis not present

## 2013-03-06 DIAGNOSIS — N189 Chronic kidney disease, unspecified: Secondary | ICD-10-CM | POA: Diagnosis present

## 2013-03-06 DIAGNOSIS — Z96659 Presence of unspecified artificial knee joint: Secondary | ICD-10-CM

## 2013-03-06 DIAGNOSIS — M1710 Unilateral primary osteoarthritis, unspecified knee: Principal | ICD-10-CM | POA: Diagnosis present

## 2013-03-06 DIAGNOSIS — M810 Age-related osteoporosis without current pathological fracture: Secondary | ICD-10-CM | POA: Diagnosis present

## 2013-03-06 DIAGNOSIS — R5382 Chronic fatigue, unspecified: Secondary | ICD-10-CM | POA: Diagnosis present

## 2013-03-06 DIAGNOSIS — Z96651 Presence of right artificial knee joint: Secondary | ICD-10-CM

## 2013-03-06 DIAGNOSIS — G9332 Myalgic encephalomyelitis/chronic fatigue syndrome: Secondary | ICD-10-CM | POA: Diagnosis present

## 2013-03-06 DIAGNOSIS — F411 Generalized anxiety disorder: Secondary | ICD-10-CM | POA: Diagnosis present

## 2013-03-06 DIAGNOSIS — M4800 Spinal stenosis, site unspecified: Secondary | ICD-10-CM | POA: Diagnosis present

## 2013-03-06 DIAGNOSIS — K219 Gastro-esophageal reflux disease without esophagitis: Secondary | ICD-10-CM | POA: Diagnosis present

## 2013-03-06 DIAGNOSIS — I10 Essential (primary) hypertension: Secondary | ICD-10-CM | POA: Diagnosis present

## 2013-03-06 DIAGNOSIS — K5289 Other specified noninfective gastroenteritis and colitis: Secondary | ICD-10-CM | POA: Diagnosis present

## 2013-03-06 DIAGNOSIS — Z87891 Personal history of nicotine dependence: Secondary | ICD-10-CM

## 2013-03-06 HISTORY — PX: TOTAL KNEE ARTHROPLASTY: SHX125

## 2013-03-06 LAB — TYPE AND SCREEN
ABO/RH(D): O POS
Antibody Screen: NEGATIVE

## 2013-03-06 SURGERY — ARTHROPLASTY, KNEE, TOTAL
Anesthesia: Spinal | Site: Knee | Laterality: Right | Wound class: Clean

## 2013-03-06 MED ORDER — ACETAMINOPHEN 500 MG PO TABS
1000.0000 mg | ORAL_TABLET | Freq: Four times a day (QID) | ORAL | Status: AC
  Administered 2013-03-06 – 2013-03-07 (×4): 1000 mg via ORAL
  Filled 2013-03-06 (×4): qty 2

## 2013-03-06 MED ORDER — ENOXAPARIN SODIUM 30 MG/0.3ML ~~LOC~~ SOLN
30.0000 mg | Freq: Two times a day (BID) | SUBCUTANEOUS | Status: DC
  Administered 2013-03-07 – 2013-03-09 (×5): 30 mg via SUBCUTANEOUS
  Filled 2013-03-06 (×7): qty 0.3

## 2013-03-06 MED ORDER — BUPIVACAINE LIPOSOME 1.3 % IJ SUSP
20.0000 mL | Freq: Once | INTRAMUSCULAR | Status: DC
  Filled 2013-03-06: qty 20

## 2013-03-06 MED ORDER — DEXAMETHASONE SODIUM PHOSPHATE 10 MG/ML IJ SOLN
10.0000 mg | Freq: Once | INTRAMUSCULAR | Status: AC
  Administered 2013-03-06: 10 mg via INTRAVENOUS

## 2013-03-06 MED ORDER — BUPIVACAINE HCL 0.25 % IJ SOLN
INTRAMUSCULAR | Status: DC | PRN
  Administered 2013-03-06: 20 mL

## 2013-03-06 MED ORDER — 0.9 % SODIUM CHLORIDE (POUR BTL) OPTIME
TOPICAL | Status: DC | PRN
  Administered 2013-03-06: 1000 mL

## 2013-03-06 MED ORDER — DEXAMETHASONE 6 MG PO TABS
10.0000 mg | ORAL_TABLET | Freq: Every day | ORAL | Status: AC
  Administered 2013-03-07: 10:00:00 10 mg via ORAL
  Filled 2013-03-06: qty 1

## 2013-03-06 MED ORDER — METHOCARBAMOL 500 MG PO TABS
500.0000 mg | ORAL_TABLET | Freq: Four times a day (QID) | ORAL | Status: DC | PRN
  Administered 2013-03-06 – 2013-03-08 (×6): 500 mg via ORAL
  Filled 2013-03-06 (×6): qty 1

## 2013-03-06 MED ORDER — STERILE WATER FOR IRRIGATION IR SOLN
Status: DC | PRN
  Administered 2013-03-06: 3000 mL

## 2013-03-06 MED ORDER — LACTATED RINGERS IV SOLN
INTRAVENOUS | Status: DC
  Administered 2013-03-06: 1000 mL via INTRAVENOUS

## 2013-03-06 MED ORDER — METOCLOPRAMIDE HCL 5 MG/ML IJ SOLN
5.0000 mg | Freq: Three times a day (TID) | INTRAMUSCULAR | Status: DC | PRN
  Administered 2013-03-07: 10 mg via INTRAVENOUS
  Filled 2013-03-06: qty 2

## 2013-03-06 MED ORDER — ZOLPIDEM TARTRATE 5 MG PO TABS
5.0000 mg | ORAL_TABLET | Freq: Every evening | ORAL | Status: DC | PRN
  Administered 2013-03-06 – 2013-03-08 (×3): 5 mg via ORAL
  Filled 2013-03-06 (×3): qty 1

## 2013-03-06 MED ORDER — MENTHOL 3 MG MT LOZG: 1.0000 | LOZENGE | OROMUCOSAL | Status: DC | PRN

## 2013-03-06 MED ORDER — ONDANSETRON HCL 4 MG PO TABS
4.0000 mg | ORAL_TABLET | Freq: Four times a day (QID) | ORAL | Status: DC | PRN
  Administered 2013-03-08: 11:00:00 4 mg via ORAL
  Filled 2013-03-06: qty 1

## 2013-03-06 MED ORDER — BUPIVACAINE HCL (PF) 0.25 % IJ SOLN
INTRAMUSCULAR | Status: AC
  Filled 2013-03-06: qty 30

## 2013-03-06 MED ORDER — DOCUSATE SODIUM 100 MG PO CAPS
100.0000 mg | ORAL_CAPSULE | Freq: Two times a day (BID) | ORAL | Status: DC
  Administered 2013-03-06 – 2013-03-09 (×6): 100 mg via ORAL

## 2013-03-06 MED ORDER — LACTATED RINGERS IV SOLN
INTRAVENOUS | Status: DC | PRN
  Administered 2013-03-06 (×2): via INTRAVENOUS

## 2013-03-06 MED ORDER — CEFAZOLIN SODIUM 1-5 GM-% IV SOLN
1.0000 g | Freq: Four times a day (QID) | INTRAVENOUS | Status: AC
  Administered 2013-03-06 (×2): 1 g via INTRAVENOUS
  Filled 2013-03-06 (×2): qty 50

## 2013-03-06 MED ORDER — SODIUM CHLORIDE 0.9 % IV SOLN: INTRAVENOUS | Status: DC

## 2013-03-06 MED ORDER — LEVOTHYROXINE SODIUM 88 MCG PO TABS
88.0000 ug | ORAL_TABLET | Freq: Every day | ORAL | Status: DC
  Administered 2013-03-07 – 2013-03-09 (×3): 88 ug via ORAL
  Filled 2013-03-06 (×5): qty 1

## 2013-03-06 MED ORDER — CEFAZOLIN SODIUM-DEXTROSE 2-3 GM-% IV SOLR
INTRAVENOUS | Status: AC
  Filled 2013-03-06: qty 50

## 2013-03-06 MED ORDER — SODIUM CHLORIDE 0.9 % IR SOLN
Status: DC | PRN
  Administered 2013-03-06: 1000 mL

## 2013-03-06 MED ORDER — PHENOL 1.4 % MT LIQD: 1.0000 | OROMUCOSAL | Status: DC | PRN

## 2013-03-06 MED ORDER — DEXTROSE-NACL 5-0.9 % IV SOLN
INTRAVENOUS | Status: DC
  Administered 2013-03-06: 15:00:00 via INTRAVENOUS

## 2013-03-06 MED ORDER — MORPHINE SULFATE 2 MG/ML IJ SOLN
1.0000 mg | INTRAMUSCULAR | Status: DC | PRN
  Administered 2013-03-06 (×3): 2 mg via INTRAVENOUS
  Administered 2013-03-06 – 2013-03-07 (×2): 1 mg via INTRAVENOUS
  Administered 2013-03-08: 18:00:00 2 mg via INTRAVENOUS
  Filled 2013-03-06 (×6): qty 1

## 2013-03-06 MED ORDER — CHLORHEXIDINE GLUCONATE 4 % EX LIQD
60.0000 mL | Freq: Once | CUTANEOUS | Status: DC
  Filled 2013-03-06: qty 60

## 2013-03-06 MED ORDER — SODIUM CHLORIDE 0.9 % IJ SOLN
INTRAMUSCULAR | Status: DC | PRN
  Administered 2013-03-06: 12:00:00

## 2013-03-06 MED ORDER — IRBESARTAN 75 MG PO TABS
75.0000 mg | ORAL_TABLET | Freq: Every day | ORAL | Status: DC
  Administered 2013-03-07 – 2013-03-09 (×3): 75 mg via ORAL
  Filled 2013-03-06 (×3): qty 1

## 2013-03-06 MED ORDER — FENTANYL CITRATE 0.05 MG/ML IJ SOLN
INTRAMUSCULAR | Status: DC | PRN
  Administered 2013-03-06: 100 ug via INTRAVENOUS

## 2013-03-06 MED ORDER — METHOCARBAMOL 100 MG/ML IJ SOLN
500.0000 mg | Freq: Four times a day (QID) | INTRAVENOUS | Status: DC | PRN
  Filled 2013-03-06: qty 5

## 2013-03-06 MED ORDER — TRIAMTERENE-HCTZ 37.5-25 MG PO TABS
0.5000 | ORAL_TABLET | Freq: Every morning | ORAL | Status: DC
  Administered 2013-03-07 – 2013-03-09 (×3): 0.5 via ORAL
  Filled 2013-03-06 (×3): qty 0.5

## 2013-03-06 MED ORDER — OXYCODONE HCL 5 MG PO TABS
5.0000 mg | ORAL_TABLET | ORAL | Status: DC | PRN
  Administered 2013-03-06 – 2013-03-08 (×12): 10 mg via ORAL
  Filled 2013-03-06 (×13): qty 2

## 2013-03-06 MED ORDER — SODIUM CHLORIDE 0.9 % IJ SOLN
INTRAMUSCULAR | Status: AC
  Filled 2013-03-06: qty 50

## 2013-03-06 MED ORDER — DEXAMETHASONE SODIUM PHOSPHATE 10 MG/ML IJ SOLN
10.0000 mg | Freq: Every day | INTRAMUSCULAR | Status: AC
Start: 2013-03-07 — End: 2013-03-07
  Filled 2013-03-06: qty 1

## 2013-03-06 MED ORDER — POLYETHYLENE GLYCOL 3350 17 G PO PACK: 17.0000 g | PACK | Freq: Every day | ORAL | Status: DC | PRN

## 2013-03-06 MED ORDER — TRAMADOL HCL 50 MG PO TABS
50.0000 mg | ORAL_TABLET | Freq: Four times a day (QID) | ORAL | Status: DC | PRN
  Administered 2013-03-08 – 2013-03-09 (×3): 100 mg via ORAL
  Filled 2013-03-06 (×5): qty 2

## 2013-03-06 MED ORDER — ESCITALOPRAM OXALATE 10 MG PO TABS
10.0000 mg | ORAL_TABLET | Freq: Every morning | ORAL | Status: DC
  Administered 2013-03-07 – 2013-03-09 (×3): 10 mg via ORAL
  Filled 2013-03-06 (×3): qty 1

## 2013-03-06 MED ORDER — FENTANYL CITRATE 0.05 MG/ML IJ SOLN: 25.0000 ug | INTRAMUSCULAR | Status: DC | PRN

## 2013-03-06 MED ORDER — DIPHENHYDRAMINE HCL 12.5 MG/5ML PO ELIX
12.5000 mg | ORAL_SOLUTION | ORAL | Status: DC | PRN
  Administered 2013-03-06: 21:00:00 25 mg via ORAL
  Filled 2013-03-06: qty 10

## 2013-03-06 MED ORDER — TRANEXAMIC ACID 100 MG/ML IV SOLN
1000.0000 mg | INTRAVENOUS | Status: AC
  Administered 2013-03-06: 1000 mg via INTRAVENOUS
  Filled 2013-03-06: qty 10

## 2013-03-06 MED ORDER — MIDAZOLAM HCL 5 MG/5ML IJ SOLN
INTRAMUSCULAR | Status: DC | PRN
  Administered 2013-03-06: 2 mg via INTRAVENOUS

## 2013-03-06 MED ORDER — ONDANSETRON HCL 4 MG/2ML IJ SOLN
4.0000 mg | Freq: Four times a day (QID) | INTRAMUSCULAR | Status: DC | PRN
  Administered 2013-03-07: 4 mg via INTRAVENOUS
  Filled 2013-03-06: qty 2

## 2013-03-06 MED ORDER — ACETAMINOPHEN 500 MG PO TABS
1000.0000 mg | ORAL_TABLET | Freq: Once | ORAL | Status: AC
  Administered 2013-03-06: 1000 mg via ORAL
  Filled 2013-03-06: qty 2

## 2013-03-06 MED ORDER — BUPIVACAINE IN DEXTROSE 0.75-8.25 % IT SOLN
INTRATHECAL | Status: DC | PRN
  Administered 2013-03-06: 2 mL via INTRATHECAL

## 2013-03-06 MED ORDER — METOCLOPRAMIDE HCL 10 MG PO TABS: 5.0000 mg | ORAL_TABLET | Freq: Three times a day (TID) | ORAL | Status: DC | PRN

## 2013-03-06 MED ORDER — IRBESARTAN 300 MG PO TABS: 300.0000 mg | ORAL_TABLET | Freq: Every day | ORAL | Status: DC

## 2013-03-06 MED ORDER — CEFAZOLIN SODIUM-DEXTROSE 2-3 GM-% IV SOLR
2.0000 g | INTRAVENOUS | Status: AC
  Administered 2013-03-06: 2 g via INTRAVENOUS

## 2013-03-06 MED ORDER — FLEET ENEMA 7-19 GM/118ML RE ENEM: 1.0000 | ENEMA | Freq: Once | RECTAL | Status: AC | PRN

## 2013-03-06 MED ORDER — BISACODYL 10 MG RE SUPP: 10.0000 mg | Freq: Every day | RECTAL | Status: DC | PRN

## 2013-03-06 MED ORDER — PROPOFOL INFUSION 10 MG/ML OPTIME
INTRAVENOUS | Status: DC | PRN
  Administered 2013-03-06: 100 ug/kg/min via INTRAVENOUS

## 2013-03-06 MED ORDER — PANTOPRAZOLE SODIUM 40 MG PO TBEC
40.0000 mg | DELAYED_RELEASE_TABLET | Freq: Every day | ORAL | Status: DC
  Filled 2013-03-06: qty 1

## 2013-03-06 SURGICAL SUPPLY — 58 items
BAG SPEC THK2 15X12 ZIP CLS (MISCELLANEOUS) ×1
BAG ZIPLOCK 12X15 (MISCELLANEOUS) ×2 IMPLANT
BANDAGE ELASTIC 6 VELCRO ST LF (GAUZE/BANDAGES/DRESSINGS) ×2 IMPLANT
BANDAGE ESMARK 6X9 LF (GAUZE/BANDAGES/DRESSINGS) ×1 IMPLANT
BLADE SAG 18X100X1.27 (BLADE) ×2 IMPLANT
BLADE SAW SGTL 11.0X1.19X90.0M (BLADE) ×2 IMPLANT
BNDG CMPR 9X6 STRL LF SNTH (GAUZE/BANDAGES/DRESSINGS) ×1
BNDG ESMARK 6X9 LF (GAUZE/BANDAGES/DRESSINGS) ×2
BOWL SMART MIX CTS (DISPOSABLE) ×2 IMPLANT
CAPT RP KNEE ×1 IMPLANT
CEMENT HV SMART SET (Cement) ×4 IMPLANT
CLOTH BEACON ORANGE TIMEOUT ST (SAFETY) ×2 IMPLANT
CUFF TOURN SGL QUICK 34 (TOURNIQUET CUFF) ×2
CUFF TRNQT CYL 34X4X40X1 (TOURNIQUET CUFF) ×1 IMPLANT
DECANTER SPIKE VIAL GLASS SM (MISCELLANEOUS) ×2 IMPLANT
DRAPE EXTREMITY T 121X128X90 (DRAPE) ×2 IMPLANT
DRAPE POUCH INSTRU U-SHP 10X18 (DRAPES) ×2 IMPLANT
DRAPE U-SHAPE 47X51 STRL (DRAPES) ×2 IMPLANT
DRSG ADAPTIC 3X8 NADH LF (GAUZE/BANDAGES/DRESSINGS) ×2 IMPLANT
DRSG PAD ABDOMINAL 8X10 ST (GAUZE/BANDAGES/DRESSINGS) ×2 IMPLANT
DURAPREP 26ML APPLICATOR (WOUND CARE) ×2 IMPLANT
ELECT REM PT RETURN 9FT ADLT (ELECTROSURGICAL) ×2
ELECTRODE REM PT RTRN 9FT ADLT (ELECTROSURGICAL) ×1 IMPLANT
EVACUATOR 1/8 PVC DRAIN (DRAIN) ×2 IMPLANT
FACESHIELD LNG OPTICON STERILE (SAFETY) ×10 IMPLANT
GLOVE BIO SURGEON STRL SZ7.5 (GLOVE) IMPLANT
GLOVE BIO SURGEON STRL SZ8 (GLOVE) ×2 IMPLANT
GLOVE BIOGEL PI IND STRL 8 (GLOVE) ×2 IMPLANT
GLOVE BIOGEL PI INDICATOR 8 (GLOVE) ×2
GLOVE SURG SS PI 6.5 STRL IVOR (GLOVE) IMPLANT
GOWN PREVENTION PLUS LG XLONG (DISPOSABLE) ×2 IMPLANT
GOWN STRL REIN XL XLG (GOWN DISPOSABLE) IMPLANT
HANDPIECE INTERPULSE COAX TIP (DISPOSABLE) ×2
IMMOBILIZER KNEE 20 (SOFTGOODS) ×2
IMMOBILIZER KNEE 20 THIGH 36 (SOFTGOODS) ×1 IMPLANT
KIT BASIN OR (CUSTOM PROCEDURE TRAY) ×2 IMPLANT
MANIFOLD NEPTUNE II (INSTRUMENTS) ×2 IMPLANT
NDL SAFETY ECLIPSE 18X1.5 (NEEDLE) ×2 IMPLANT
NEEDLE HYPO 18GX1.5 SHARP (NEEDLE) ×4
NS IRRIG 1000ML POUR BTL (IV SOLUTION) ×2 IMPLANT
PACK TOTAL JOINT (CUSTOM PROCEDURE TRAY) ×2 IMPLANT
PADDING CAST COTTON 6X4 STRL (CAST SUPPLIES) ×5 IMPLANT
POSITIONER SURGICAL ARM (MISCELLANEOUS) ×2 IMPLANT
SET HNDPC FAN SPRY TIP SCT (DISPOSABLE) ×1 IMPLANT
SPONGE GAUZE 4X4 12PLY (GAUZE/BANDAGES/DRESSINGS) ×2 IMPLANT
STRIP CLOSURE SKIN 1/2X4 (GAUZE/BANDAGES/DRESSINGS) ×3 IMPLANT
SUCTION FRAZIER 12FR DISP (SUCTIONS) ×2 IMPLANT
SUT MNCRL AB 4-0 PS2 18 (SUTURE) ×2 IMPLANT
SUT VIC AB 2-0 CT1 27 (SUTURE) ×6
SUT VIC AB 2-0 CT1 TAPERPNT 27 (SUTURE) ×3 IMPLANT
SUT VLOC 180 0 24IN GS25 (SUTURE) ×2 IMPLANT
SYR 20CC LL (SYRINGE) ×2 IMPLANT
SYR 50ML LL SCALE MARK (SYRINGE) ×2 IMPLANT
TAPE STRIPS DRAPE STRL (GAUZE/BANDAGES/DRESSINGS) ×1 IMPLANT
TOWEL OR 17X26 10 PK STRL BLUE (TOWEL DISPOSABLE) ×4 IMPLANT
TRAY FOLEY CATH 14FRSI W/METER (CATHETERS) ×2 IMPLANT
WATER STERILE IRR 1500ML POUR (IV SOLUTION) ×2 IMPLANT
WRAP KNEE MAXI GEL POST OP (GAUZE/BANDAGES/DRESSINGS) ×2 IMPLANT

## 2013-03-06 NOTE — Op Note (Signed)
Pre-operative diagnosis- Osteoarthritis  Right knee(s)  Post-operative diagnosis- Osteoarthritis Right knee(s)  Procedure-  Right  Total Knee Arthroplasty  Surgeon- Gus Rankin. Briant Angelillo, MD  Assistant- Dimitri Ped, PA-C   Anesthesia-  Spinal EBL-* No blood loss amount entered *  Drains Hemovac  Tourniquet time-  Total Tourniquet Time Documented: Thigh (Right) - 31 minutes Total: Thigh (Right) - 31 minutes    Complications- None  Condition-PACU - hemodynamically stable.   Brief Clinical Note  Victoria Rodgers is a 79 y.o. year old female with end stage OA of her right knee with progressively worsening pain and dysfunction. She has constant pain, with activity and at rest and significant functional deficits with difficulties even with ADLs. She has had extensive non-op management including analgesics, injections of cortisone and viscosupplements, and home exercise program, but remains in significant pain with significant dysfunction.Radiographs show bone on bone arthritis lateral and patellofemoral. She presents now for right Total Knee Arthroplasty.    Procedure in detail---   The patient is brought into the operating room and positioned supine on the operating table. After successful administration of  Spinal,   a tourniquet is placed high on the  Right thigh(s) and the lower extremity is prepped and draped in the usual sterile fashion. Time out is performed by the operating team and then the  right lower extremity is wrapped in Esmarch, knee flexed and the tourniquet inflated to 300 mmHg.       A midline incision is made with a ten blade through the subcutaneous tissue to the level of the extensor mechanism. A fresh blade is used to make a medial parapatellar arthrotomy. Soft tissue over the proximal medial tibia is subperiosteally elevated to the joint line with a knife and into the semimembranosus bursa with a Cobb elevator. Soft tissue over the proximal lateral tibia is elevated  with attention being paid to avoiding the patellar tendon on the tibial tubercle. The patella is everted, knee flexed 90 degrees and the ACL and PCL are removed. Findings are bone on bone lateral and patellofemoral with exposed bone medial also.        The drill is used to create a starting hole in the distal femur and the canal is thoroughly irrigated with sterile saline to remove the fatty contents. The 5 degree Right  valgus alignment guide is placed into the femoral canal and the distal femoral cutting block is pinned to remove 10 mm off the distal femur. Resection is made with an oscillating saw.      The tibia is subluxed forward and the menisci are removed. The extramedullary alignment guide is placed referencing proximally at the medial aspect of the tibial tubercle and distally along the second metatarsal axis and tibial crest. The block is pinned to remove 2mm off the more deficient lateral  side. Resection is made with an oscillating saw. Size 3is the most appropriate size for the tibia and the proximal tibia is prepared with the modular drill and keel punch for that size.      The femoral sizing guide is placed and size 4 narrow is most appropriate. Rotation is marked off the epicondylar axis and confirmed by creating a rectangular flexion gap at 90 degrees. The size 4 cutting block is pinned in this rotation and the anterior, posterior and chamfer cuts are made with the oscillating saw. The intercondylar block is then placed and that cut is made.      Trial size 3 tibial component, trial size  4 narrow posterior stabilized femur and a 12.5  mm posterior stabilized rotating platform insert trial is placed. Full extension is achieved with excellent varus/valgus and anterior/posterior balance throughout full range of motion. The patella is everted and thickness measured to be 22  mm. Free hand resection is taken to 12 mm, a 38 template is placed, lug holes are drilled, trial patella is placed, and it  tracks normally. Osteophytes are removed off the posterior femur with the trial in place. All trials are removed and the cut bone surfaces prepared with pulsatile lavage. Cement is mixed and once ready for implantation, the size 3 tibial implant, size  4 narrow posterior stabilized femoral component, and the size 38 patella are cemented in place and the patella is held with the clamp. The trial insert is placed and the knee held in full extension. The Exparel (20 ml mixed with 30 ml saline) and .25% Bupivicaine, are injected into the extensor mechanism, posterior capsule, medial and lateral gutters and subcutaneous tissues.  All extruded cement is removed and once the cement is hard the permanent 12.5 mm posterior stabilized rotating platform insert is placed into the tibial tray.      The wound is copiously irrigated with saline solution and the extensor mechanism closed over a hemovac drain with #1 PDS suture. The tourniquet is released for a total tourniquet time of 31  minutes. Flexion against gravity is 140 degrees and the patella tracks normally. Subcutaneous tissue is closed with 2.0 vicryl and subcuticular with running 4.0 Monocryl. The incision is cleaned and dried and steri-strips and a bulky sterile dressing are applied. The limb is placed into a knee immobilizer and the patient is awakened and transported to recovery in stable condition.      Please note that a surgical assistant was a medical necessity for this procedure in order to perform it in a safe and expeditious manner. Surgical assistant was necessary to retract the ligaments and vital neurovascular structures to prevent injury to them and also necessary for proper positioning of the limb to allow for anatomic placement of the prosthesis.   Gus Rankin Magally Vahle, MD    03/06/2013, 12:26 PM

## 2013-03-06 NOTE — Anesthesia Procedure Notes (Signed)
Spinal  Patient location during procedure: OR Start time: 03/06/2013 11:30 AM Staffing Anesthesiologist: Azell Der CRNA/Resident: [place], Suzana Sohail C Performed by: resident/CRNA  Preanesthetic Checklist Completed: patient identified, site marked, surgical consent, pre-op evaluation, timeout performed, IV checked, risks and benefits discussed and monitors and equipment checked Spinal Block Patient position: sitting Prep: Betasept and site prepped and draped Patient monitoring: heart rate, cardiac monitor, continuous pulse ox and blood pressure Approach: midline Location: L3-4 Injection technique: single-shot Needle Needle gauge: 22 G Assessment Sensory level: T6

## 2013-03-06 NOTE — H&P (View-Only) (Signed)
Victoria Rodgers  DOB: 05/31/1928 Married / Language: English / Race: White Female  Date of Admission:  03/06/2013  Chief Complaint:  Right Knee Pain  History of Present Illness The patient is a 79 year old female who comes in for a preoperative History and Physical. The patient is scheduled for a right total knee arthroplasty to be performed by Dr. Frank V. Aluisio, MD at [facility] on 03/06/2013. The patient is a 79 year old female who presents for follow up of their knee. The patient is being followed for their right knee pain and osteoarthritis. They are now week(s) out from Euflexxa series (and almost 5 months out from knee arthroscopy). Symptoms reported today include: pain, aching, instability and difficulty ambulating. The patient feels that they are doing poorly and report their pain level to be moderate to severe. The patient has not gotten any relief of their symptoms with viscosupplementation.  Victoria Rodgers comes back in for follow up after the gel series. She states there has been no benefit from the injections. The knee feels like it did back prior to having her knee scope. It wants to buckle and catch with her sometimes but no swelling. Icing really has not helped much. Unfortunately the right knee is getting progressively worse. This is as bad as the left knee was prior to the time we replaced it. She does have advanced arthritis in the lateral compartment of that right knee. She is now ready to get her knee fixed. They have been treated conservatively in the past for the above stated problem and despite conservative measures, they continue to have progressive pain and severe functional limitations and dysfunction. They have failed non-operative management including home exercise, medications, and injections. It is felt that they would benefit from undergoing total joint replacement. Risks and benefits of the procedure have been discussed with the patient and  they elect to proceed with surgery. There are no active contraindications to surgery such as ongoing infection or rapidly progressive neurological disease.   Problem List Primary osteoarthritis of one knee (715.16)  Allergies Sulfa Drugs. Nausea.   Family History Osteoporosis. mother and brother Osteoarthritis. mother Cancer. mother, father and brother Cerebrovascular Accident. grandmother mothers side Hypertension. mother, grandmother mothers side and grandmother fathers side Heart Disease. mother, grandmother mothers side and grandmother fathers side   Social History Tobacco use. former smoker; smoke(d) 1/2 pack(s) per day Tobacco / smoke exposure. no Illicit drug use. no Exercise. Exercises daily; does other Living situation. live with spouse Pain Contract. no Marital status. married Drug/Alcohol Rehab (Previously). no Alcohol use. current drinker; drinks wine; only occasionally per week Children. 3 Drug/Alcohol Rehab (Currently). no Current work status. retired   Medication History Triamterene-HCTZ (37.5-25MG Tablet, Oral) Active. Benicar (20MG Tablet, Oral) Active. Levothyroxine Sodium (88MCG Tablet, Oral) Active. Escitalopram Oxalate (10MG Tablet, Oral) Active. NexIUM (40MG Capsule DR, Oral) Active. Premarin (0.3MG Tablet, Oral) Active. Caltrate 600+D (600-400MG-UNIT Tablet, Oral) Active. Acetaminophen (650MG Tablet, Oral) Active.   Past Surgical History Appendectomy Cataract Surgery. bilateral Dilation and Curettage of Uterus - Multiple Cesarean Delivery. 1 time Total Knee Replacement. left Total Hip Replacement. right Hysterectomy. complete (non-cancerous) Foot Surgery. left Neck Disc Surgery Tonsillectomy Spinal Surgery  Medical History Anemia Anxiety Disorder Gastroesophageal Reflux Disease Chronic Cystitis Osteoporosis Hypercholesterolemia High blood pressure Hypothyroidism Osteoarthritis Irritable bowel  syndrome Bursitis Chronic fatigue syndrome   Review of Systems General:Not Present- Chills, Fever, Night Sweats, Fatigue, Weight Gain, Weight Loss and Memory Loss. Skin:Not Present- Hives, Itching, Rash,   Eczema and Lesions. HEENT:Not Present- Tinnitus, Headache, Double Vision, Visual Loss, Hearing Loss and Dentures. Respiratory:Not Present- Shortness of breath with exertion, Shortness of breath at rest, Allergies, Coughing up blood and Chronic Cough. Cardiovascular:Not Present- Chest Pain, Racing/skipping heartbeats, Difficulty Breathing Lying Down, Murmur, Swelling and Palpitations. Gastrointestinal:Not Present- Bloody Stool, Heartburn, Abdominal Pain, Vomiting, Nausea, Constipation, Diarrhea, Difficulty Swallowing, Jaundice and Loss of appetitie. Female Genitourinary:Not Present- Blood in Urine, Urinary frequency, Weak urinary stream, Discharge, Flank Pain, Incontinence, Painful Urination, Urgency, Urinary Retention and Urinating at Night. Musculoskeletal:Present- Joint Pain. Not Present- Muscle Weakness, Muscle Pain, Joint Swelling, Back Pain, Morning Stiffness and Spasms. Neurological:Not Present- Tremor, Dizziness, Blackout spells, Paralysis, Difficulty with balance and Weakness. Psychiatric:Not Present- Insomnia.   Vitals Weight: 165 lb Height: 67 in Weight was reported by patient. Height was reported by patient. Body Surface Area: 1.88 m Body Mass Index: 25.84 kg/m Pulse: 72 (Regular) Resp.: 16 (Unlabored) BP: 144/78 (Sitting, Left Arm, Standard)    Physical Exam The physical exam findings are as follows:   General Mental Status - Alert, cooperative and good historian. General Appearance- pleasant. Not in acute distress. Orientation- Oriented X3. Build & Nutrition- Well nourished and Well developed.   Head and Neck Head- normocephalic, atraumatic . Neck Global Assessment- supple. no bruit auscultated on the right and no bruit  auscultated on the left.   Eye Pupil- Bilateral- Regular and Round. Motion- Bilateral- EOMI.   Chest and Lung Exam Auscultation: Breath sounds:- clear at anterior chest wall and - clear at posterior chest wall. Adventitious sounds:- No Adventitious sounds.   Cardiovascular Auscultation:Rhythm- Regular rate and rhythm. Heart Sounds- S1 WNL and S2 WNL. Murmurs & Other Heart Sounds:Auscultation of the heart reveals - No Murmurs.   Abdomen Palpation/Percussion:Tenderness- Abdomen is non-tender to palpation. Rigidity (guarding)- Abdomen is soft. Auscultation:Auscultation of the abdomen reveals - Bowel sounds normal.   Female Genitourinary Not done, not pertinent to present illness  Musculoskeletal On exam she is alert and oriented in no apparent distress. Evaluation of her right knee shows slight valgus. There is no effusion. Range is about 5 to 125 with marked crepitus on range of motion. Tender lateral greater than medial with no instability.  Assessment & Plan Primary osteoarthritis of one knee (715.16) Impression: Right Knee  Pt Education - How to access health information online: discussed with patient and provided information.  Note: Plan is for a Right Total Knee Replacement by Dr. Aluisio.  Plan is to go to the [facility] at [facility] in [place]  PCP - Dr. Hedrick  The patient does not have any contraindications and will recieve TXA (tranexamic acid) prior to surgery.  Signed electronically by Siobhan Zaro L Tylek Boney, III PA-C 

## 2013-03-06 NOTE — Progress Notes (Signed)
Utilization review completed.  

## 2013-03-06 NOTE — Interval H&P Note (Signed)
History and Physical Interval Note:  03/06/2013 9:31 AM  Victoria Rodgers  has presented today for surgery, with the diagnosis of OA RIGHT KNEE   The various methods of treatment have been discussed with the patient and family. After consideration of risks, benefits and other options for treatment, the patient has consented to  Procedure(s): RIGHT TOTAL KNEE ARTHROPLASTY (Right) as a surgical intervention .  The patient's history has been reviewed, patient examined, no change in status, stable for surgery.  I have reviewed the patient's chart and labs.  Questions were answered to the patient's satisfaction.     Loanne Drilling

## 2013-03-06 NOTE — Transfer of Care (Signed)
Immediate Anesthesia Transfer of Care Note  Patient: Victoria Rodgers  Procedure(s) Performed: Procedure(s): RIGHT TOTAL KNEE ARTHROPLASTY (Right)  Patient Location: PACU  Anesthesia Type:MAC and Spinal  Level of Consciousness: awake and alert   Airway & Oxygen Therapy: Patient Spontanous Breathing and Patient connected to face mask oxygen  Post-op Assessment: Report given to PACU RN and Post -op Vital signs reviewed and stable  Post vital signs: Reviewed and stable  Complications: No apparent anesthesia complications

## 2013-03-06 NOTE — Anesthesia Preprocedure Evaluation (Signed)
Anesthesia Evaluation  Patient identified by MRN, date of birth, ID band Patient awake    Reviewed: Allergy & Precautions, H&P , NPO status , Patient's Chart, lab work & pertinent test results  Airway Mallampati: II TM Distance: >3 FB Neck ROM: Full    Dental no notable dental hx.    Pulmonary neg pulmonary ROS,  breath sounds clear to auscultation  Pulmonary exam normal       Cardiovascular hypertension, Pt. on medications negative cardio ROS  Rhythm:Regular Rate:Normal  CXR and ECG OK.   Neuro/Psych Anxiety negative neurological ROS     GI/Hepatic Neg liver ROS, GERD-  Medicated,  Endo/Other  Hypothyroidism   Renal/GU Renal disease  negative genitourinary   Musculoskeletal negative musculoskeletal ROS (+)   Abdominal   Peds negative pediatric ROS (+)  Hematology negative hematology ROS (+)   Anesthesia Other Findings   Reproductive/Obstetrics negative OB ROS                           Anesthesia Physical Anesthesia Plan  ASA: II  Anesthesia Plan: Spinal   Post-op Pain Management:    Induction: Intravenous  Airway Management Planned:   Additional Equipment:   Intra-op Plan:   Post-operative Plan:   Informed Consent: I have reviewed the patients History and Physical, chart, labs and discussed the procedure including the risks, benefits and alternatives for the proposed anesthesia with the patient or authorized representative who has indicated his/her understanding and acceptance.   Dental advisory given  Plan Discussed with: CRNA  Anesthesia Plan Comments: (Discussed risks/benefits of spinal including headache, backache, failure, bleeding, infection, and nerve damage. Patient consents to spinal. Questions answered. Coagulation studies and platelet count acceptable.  She has had three lumbar discectomies which could make spinal difficult., but she would like to try spinal.)         Anesthesia Quick Evaluation

## 2013-03-06 NOTE — Anesthesia Postprocedure Evaluation (Signed)
  Anesthesia Post-op Note  Patient: Victoria Rodgers  Procedure(s) Performed: Procedure(s) (LRB): RIGHT TOTAL KNEE ARTHROPLASTY (Right)  Patient Location: PACU  Anesthesia Type: Spinal  Level of Consciousness: awake and alert   Airway and Oxygen Therapy: Patient Spontanous Breathing  Post-op Pain: mild  Post-op Assessment: Post-op Vital signs reviewed, Patient's Cardiovascular Status Stable, Respiratory Function Stable, Patent Airway and No signs of Nausea or vomiting  Last Vitals:  Filed Vitals:   03/06/13 1515  BP:   Pulse:   Temp:   Resp: 16    Post-op Vital Signs: stable   Complications: No apparent anesthesia complications. Moving feet.

## 2013-03-07 DIAGNOSIS — K219 Gastro-esophageal reflux disease without esophagitis: Secondary | ICD-10-CM | POA: Diagnosis present

## 2013-03-07 DIAGNOSIS — E039 Hypothyroidism, unspecified: Secondary | ICD-10-CM | POA: Diagnosis present

## 2013-03-07 DIAGNOSIS — I10 Essential (primary) hypertension: Secondary | ICD-10-CM | POA: Diagnosis present

## 2013-03-07 DIAGNOSIS — D631 Anemia in chronic kidney disease: Secondary | ICD-10-CM | POA: Diagnosis present

## 2013-03-07 LAB — BASIC METABOLIC PANEL
BUN: 15 mg/dL (ref 6–23)
CO2: 23 mEq/L (ref 19–32)
Calcium: 8 mg/dL — ABNORMAL LOW (ref 8.4–10.5)
Creatinine, Ser: 1.36 mg/dL — ABNORMAL HIGH (ref 0.50–1.10)
Glucose, Bld: 149 mg/dL — ABNORMAL HIGH (ref 70–99)

## 2013-03-07 LAB — CBC
HCT: 29.5 % — ABNORMAL LOW (ref 36.0–46.0)
Hemoglobin: 9.1 g/dL — ABNORMAL LOW (ref 12.0–15.0)
MCH: 29.7 pg (ref 26.0–34.0)
MCHC: 30.8 g/dL (ref 30.0–36.0)
MCV: 96.4 fL (ref 78.0–100.0)
RBC: 3.06 MIL/uL — ABNORMAL LOW (ref 3.87–5.11)

## 2013-03-07 MED ORDER — NON FORMULARY: 20.0000 mg | Freq: Two times a day (BID) | Status: DC

## 2013-03-07 MED ORDER — ESOMEPRAZOLE MAGNESIUM 20 MG PO CPDR
20.0000 mg | DELAYED_RELEASE_CAPSULE | Freq: Two times a day (BID) | ORAL | Status: DC
  Administered 2013-03-07 – 2013-03-09 (×5): 20 mg via ORAL
  Filled 2013-03-07 (×7): qty 1

## 2013-03-07 NOTE — Progress Notes (Signed)
Physical Therapy Treatment Patient Detail Name: Victoria Rodgers MRN: [id number] DOB: 09/05/1927 Today's Date: 03/07/2013 Time: 4098-1191 PT Time Calculation (min): 25 min  PT Assessment / Plan / Recommendation  History of Present Illness RTKA on 03/06/13   PT Comments   Pt up to [facility] without KI and tolerated well.   Follow Up Recommendations  SNF     Does the patient have the potential to tolerate intense rehabilitation     Barriers to Discharge        Equipment Recommendations  None recommended by PT    Recommendations for Other Services    Frequency 7X/week   Progress towards PT Goals Progress towards PT goals: Progressing toward goals  Plan Current plan remains appropriate    Precautions / Restrictions Precautions Precautions: Knee Required Braces or Orthoses: Knee Immobilizer - Right Knee Immobilizer - Right: Discontinue once straight leg raise with < 10 degree lag   Pertinent Vitals/Pain 5 r knee with medication    Mobility  Bed Mobility Bed Mobility: Sit to Supine Supine to Sit: 4: Min assist Sitting - Scoot to Edge of Bed: 4: Min assist Sit to Supine: 4: Min assist Details for Bed Mobility Assistance: support of RLE off of bed and onto bed Transfers Transfers: Sit to Stand;Stand to [employer] Transfers Sit to Stand: 4: Min assist;With upper extremity assist;From chair/3-in-1;From bed;With armrests Stand to Sit: With upper extremity assist;4: Min assist;To bed;To chair/3-in-1;With armrests Stand Pivot Transfers: 4: Min assist Details for Transfer Assistance: verbal cues for safety sit to stand from [facility]. Pt recalled to place RLE forward, support under R leg for sitting without KI Ambulation/Gait Ambulation/Gait Assistance: 1: +2 Total assist Ambulation/Gait: Patient Percentage: 90% Ambulation Distance (Feet): 100 Feet Assistive device: Rolling walker Ambulation/Gait Assistance Details: verbal cues for safe use RW , sequence and posture. Gait Pattern:  Step-to pattern Gait velocity: decr.    Exercises Total Joint Exercises Quad Sets: AROM;Right;Supine;10 reps Heel Slides: AAROM;Right;10 reps;Supine Hip ABduction/ADduction: AAROM;Right;10 reps;Supine Straight Leg Raises: AAROM;Right;10 reps;Supine Goniometric ROM: 10-40 R knee   PT Diagnosis: Difficulty walking;Acute pain  PT Problem List: Decreased strength;Decreased range of motion;Decreased activity tolerance;Decreased mobility;Pain;Decreased knowledge of precautions;Decreased safety awareness;Decreased knowledge of use of DME PT Treatment Interventions: DME instruction;Gait training;Functional mobility training;Therapeutic activities;Therapeutic exercise;Patient/family education   PT Goals (current goals can now be found in the care plan section) Acute Rehab PT Goals Patient Stated Goal: I want to be without pain. PT Goal Formulation: With patient Time For Goal Achievement: 03/14/13 Potential to Achieve Goals: Good  Visit Information  Last PT Received On: 03/07/13 Assistance Needed: +1 History of Present Illness: RTKA on 03/06/13    Subjective Data  Patient Stated Goal: I want to be without pain.   Cognition  Cognition Arousal/Alertness: Awake/alert Behavior During Therapy: WFL for tasks assessed/performed Overall Cognitive Status: Within Functional Limits for tasks assessed    Balance     End of Session PT - End of Session Equipment Utilized During Treatment: Left knee immobilizer Activity Tolerance: Patient tolerated treatment well Patient left: in bed;with call bell/phone within reach;with family/visitor present Nurse Communication: Mobility status CPM Right Knee CPM Right Knee:    GP     Victoria Rodgers 03/07/2013, 2:24 PM

## 2013-03-07 NOTE — Progress Notes (Signed)
Physical Therapy Treatment Patient Details Name: Victoria Rodgers MRN: [id number] DOB: [birth year] Today's Date: 03/07/2013 Time: 8119-1478 PT Time Calculation (min): 11 min  PT Assessment / Plan / Recommendation  History of Present Illness RTKA on 03/06/13   PT Comments   Treatment limited by pain. Had medication, ice applied.   Follow Up Recommendations  SNF     Does the patient have the potential to tolerate intense rehabilitation     Barriers to Discharge        Equipment Recommendations  None recommended by PT    Recommendations for Other Services    Frequency 7X/week   Progress towards PT Goals Progress towards PT goals: Progressing toward goals  Plan Current plan remains appropriate    Precautions / Restrictions Precautions Precautions: Knee Required Braces or Orthoses: Knee Immobilizer - Right Knee Immobilizer - Right: Discontinue once straight leg raise with < 10 degree lag   Pertinent Vitals/Pain 7 premedicated.    Mobility  Bed Mobility Bed Mobility: Sit to Supine Supine to Sit: 4: Min assist Sitting - Scoot to Edge of Bed: 4: Min assist Sit to Supine: 4: Min assist Details for Bed Mobility Assistance: support of RLE onto bed Transfers Transfers: Sit to Stand;Stand to [employer] Transfers Sit to Stand: 4: Min assist;With upper extremity assist;From chair/3-in-1 Stand to Sit: With upper extremity assist;4: Min assist;To bed Stand Pivot Transfers: 4: Min assist Details for Transfer Assistance: verbal cues for pushing from chair/reaching back, RLE position prior to sitting down. Pt does not feel well enough to walk. Ambulation/Gait Ambulation/Gait Assistance: 1: +2 Total assist Ambulation/Gait: Patient Percentage: 90% Ambulation Distance (Feet): 100 Feet Assistive device: Rolling walker Ambulation/Gait Assistance Details: verbal cues for safe use RW , sequence and posture. Gait Pattern: Step-to pattern Gait velocity: decr.    Exercises     PT  Diagnosis: Difficulty walking;Acute pain  PT Problem List: Decreased strength;Decreased range of motion;Decreased activity tolerance;Decreased mobility;Pain;Decreased knowledge of precautions;Decreased safety awareness;Decreased knowledge of use of DME PT Treatment Interventions: DME instruction;Gait training;Functional mobility training;Therapeutic activities;Therapeutic exercise;Patient/family education   PT Goals (current goals can now be found in the care plan section) Acute Rehab PT Goals Patient Stated Goal: I want to be without pain. PT Goal Formulation: With patient Time For Goal Achievement: 03/14/13 Potential to Achieve Goals: Good  Visit Information  Last PT Received On: 03/07/13 Assistance Needed: +1 History of Present Illness: RTKA on 03/06/13    Subjective Data  Patient Stated Goal: I want to be without pain.   Cognition  Cognition Arousal/Alertness: Awake/alert Behavior During Therapy: WFL for tasks assessed/performed Overall Cognitive Status: Within Functional Limits for tasks assessed    Balance     End of Session PT - End of Session Equipment Utilized During Treatment: Left knee immobilizer Activity Tolerance: Patient limited by fatigue;Patient limited by pain Patient left: with call bell/phone within reach;in bed Nurse Communication: Mobility status   GP     Rada Hay 03/07/2013, 12:21 PM

## 2013-03-07 NOTE — Evaluation (Signed)
Physical Therapy Evaluation Patient Details Name: Victoria Rodgers MRN: [id number] DOB: [birth year] Today's Date: 03/07/2013 Time: 4098-1191 PT Time Calculation (min): 17 min  PT Assessment / Plan / Recommendation History of Present Illness  RTKA on 03/06/13  Clinical Impression  Pt tolerated very well walking. Pt plans to DC to SNF. Pt will benefit from PT while in acute care to increase ROM, Strength and safe use of RW,   PT Assessment  Patient needs continued PT services    Follow Up Recommendations  SNF    Does the patient have the potential to tolerate intense rehabilitation      Barriers to Discharge        Equipment Recommendations  None recommended by PT    Recommendations for Other Services     Frequency 7X/week    Precautions / Restrictions Precautions Precautions: Knee Required Braces or Orthoses: Knee Immobilizer - Right Knee Immobilizer - Right: Discontinue once straight leg raise with < 10 degree lag Restrictions Weight Bearing Restrictions: No   Pertinent Vitals/Pain 5 , premedicated, ice.      Mobility  Bed Mobility Bed Mobility: Supine to Sit;Sitting - Scoot to Edge of Bed Supine to Sit: 4: Min assist Sitting - Scoot to [place] of Bed: 4: Min assist Details for Bed Mobility Assistance: cues for technique to slide leg to edge. Transfers Transfers: Sit to Stand;Stand to Sit Sit to Stand: 4: Min assist;From bed;With upper extremity assist Stand to Sit: To chair/3-in-1;With upper extremity assist;4: Min assist Details for Transfer Assistance: Pt initially dizzy and nauseated after sitting up which decreased. . verbal cues for sequence and posture and safe use of RW.t Ambulation/Gait Ambulation/Gait Assistance: 1: +2 Total assist Ambulation/Gait: Patient Percentage: 90% Ambulation Distance (Feet): 100 Feet Assistive device: Rolling walker Ambulation/Gait Assistance Details: verbal cues for safe use RW , sequence and posture. Gait Pattern: Step-to  pattern Gait velocity: decr.    Exercises     PT Diagnosis: Difficulty walking;Acute pain  PT Problem List: Decreased strength;Decreased range of motion;Decreased activity tolerance;Decreased mobility;Pain;Decreased knowledge of precautions;Decreased safety awareness;Decreased knowledge of use of DME PT Treatment Interventions: DME instruction;Gait training;Functional mobility training;Therapeutic activities;Therapeutic exercise;Patient/family education     PT Goals(Current goals can be found in the care plan section) Acute Rehab PT Goals Patient Stated Goal: I want to be without pain. PT Goal Formulation: With patient Time For Goal Achievement: 03/14/13 Potential to Achieve Goals: Good  Visit Information  Last PT Received On: 03/07/13 Assistance Needed: +1 History of Present Illness: RTKA on 03/06/13       Prior Functioning  Prior Function Level of Independence: Independent    Cognition  Cognition Arousal/Alertness: Awake/alert Behavior During Therapy: WFL for tasks assessed/performed Overall Cognitive Status: Within Functional Limits for tasks assessed    Extremity/Trunk Assessment Upper Extremity Assessment Upper Extremity Assessment: Overall WFL for tasks assessed Lower Extremity Assessment Lower Extremity Assessment: RLE deficits/detail RLE Deficits / Details: able to perform SLR.   Balance    End of Session PT - End of Session Equipment Utilized During Treatment: Left knee immobilizer Activity Tolerance: Patient limited by fatigue;Patient limited by pain Patient left: in chair;with call bell/phone within reach Nurse Communication: Mobility status CPM Right Knee CPM Right Knee: Off  GP     Rada Hay 03/07/2013, 11:10 AM Blanchard Kelch PT [phone]

## 2013-03-07 NOTE — Progress Notes (Signed)
   Subjective: 1 Day Post-Op Procedure(s) (LRB): RIGHT TOTAL KNEE ARTHROPLASTY (Right) Patient reports pain as moderate.   Patient seen in rounds with Dr. Lequita Halt. Tough night but better this morning. Patient is well, but has had some minor complaints of pain in the knee, requiring pain medications We will start therapy today.  Plan is to go Healthcare Unit at [facility] in [place] after hospital stay.  Objective: Vital signs in last 24 hours: Temp:  [97.6 F (36.4 C)-99 F (37.2 C)] 97.9 F (36.6 C) (09/16 0644) Pulse Rate:  [65-100] 71 (09/16 0644) Resp:  [15-20] 16 (09/16 0644) BP: (130-181)/(63-101) 130/76 mmHg (09/16 0644) SpO2:  [95 %-100 %] 98 % (09/16 0644) Weight:  [76.476 kg (168 lb 9.6 oz)] 76.476 kg (168 lb 9.6 oz) (09/15 1454)  Intake/Output from previous day:  Intake/Output Summary (Last 24 hours) at 03/07/13 0800 Last data filed at 03/07/13 0700  Gross per 24 hour  Intake 3857.5 ml  Output   1765 ml  Net 2092.5 ml    Intake/Output this shift: UOP 775 since MN +2092.5  Labs:  Recent Labs  03/07/13 0506  HGB 9.1*    Recent Labs  03/07/13 0506  WBC 8.9  RBC 3.06*  HCT 29.5*  PLT 227    Recent Labs  03/07/13 0506  NA 138  K 3.9  CL 106  CO2 23  BUN 15  CREATININE 1.36*  GLUCOSE 149*  CALCIUM 8.0*   No results found for this basename: LABPT, INR,  in the last 72 hours  EXAM General - Patient is Alert, Appropriate and Oriented Extremity - Neurovascular intact Sensation intact distally Dorsiflexion/Plantar flexion intact Dressing - dressing C/D/I Motor Function - intact, moving foot and toes well on exam.  Hemovac pulled without difficulty.  Past Medical History  Diagnosis Date  . Arthritis   . Bruises easily   . GERD (gastroesophageal reflux disease)     takes Nexium daily  . Anxiety     takes Celexa daily  . Hypertension     takes Benicar and Maxzide daily  . Hypothyroidism     takes Synthroid daily  . SS (spinal  stenosis)   . Chronic colitis   . Chronic cystitis     STRESS CYSTITIS  . Osteoporosis   . Iron deficiency anemia SECONDARY TO KIDNEY DISEASE--  FOLLOWED BY DR Cathie Beams  . Anemia in chronic kidney disease   . B12 deficiency anemia   . Chronic kidney disease     chronic renal insufficiency    Assessment/Plan: 1 Day Post-Op Procedure(s) (LRB): RIGHT TOTAL KNEE ARTHROPLASTY (Right) Principal Problem:   OA (osteoarthritis) of knee Active Problems:   Anemia in chronic kidney disease   GERD (gastroesophageal reflux disease) - Resumed nexium   Unspecified hypothyroidism - resumed levothyroxine   Unspecified essential hypertension - resumed Benicar and Maxide  Estimated body mass index is 27.23 kg/(m^2) as calculated from the following:   Height as of this encounter: 5\' 6"  (1.676 m).   Weight as of this encounter: 76.476 kg (168 lb 9.6 oz). Advance diet Up with therapy Discharge to SNF - [facility] in [place].  DVT Prophylaxis - Lovenox for 10 days and then ASA 325 mg daily for three weeks, and then a baby ASA Weight-Bearing as tolerated to right leg No vaccines. D/C O2 and Pulse OX and try on Room Air  Victoria Rodgers 03/07/2013, 8:00 AM

## 2013-03-07 NOTE — Progress Notes (Signed)
Clinical Social Work Department BRIEF PSYCHOSOCIAL ASSESSMENT 03/07/2013  Patient:  Victoria Rodgers, Victoria Rodgers     Account Number:  [id number]     Admit date:  03/06/2013  Clinical Social Worker:  Candie Chroman  Date/Time:  03/07/2013 09:05 AM  Referred by:  Physician  Date Referred:  03/07/2013 Referred for  SNF Placement   Other Referral:   Interview type:  Patient Other interview type:    PSYCHOSOCIAL DATA Living Status:  FACILITY Admitted from facility:  [facility] Level of care:  Independent Living Primary support name:  Hanna Ra Primary support relationship to patient:  SPOUSE Degree of support available:   unclear    CURRENT CONCERNS Current Concerns  Post-Acute Placement   Other Concerns:    SOCIAL WORK ASSESSMENT / PLAN Pt is an 79 yr old female admitted from [facility]. CSW met with pt to assist with d/c planning. Pt plans to have ST Rehab at [facility] ( SNF ) following hospital d/c. CSW has contacted [facility] and D/C plan has been confirmed. CSW will continue to follow to assist with d/c planning to SNF.   Assessment/plan status:  Psychosocial Support/Ongoing Assessment of Needs Other assessment/ plan:   Information/referral to community resources:   Insurance coverage for SNF and ambulance transport reviewed. Pt is aware t that insurance may not cover transport to SNF.    PATIENT'S/FAMILY'S RESPONSE TO PLAN OF CARE: Pt is looking forward to rehab at [facility].   Cori Razor LCSW [phone]

## 2013-03-07 NOTE — Progress Notes (Signed)
OT Cancellation Note  Patient Details Name: Victoria Rodgers MRN: [id number] DOB: 01/16/1928   Cancelled Treatment:    Reason Eval/Treat Not Completed: OT screened, no needs identified, will sign off;Other (comment) (defer to SNF)  Lennox Laity [phone] 03/07/2013, 10:48 AM

## 2013-03-08 LAB — BASIC METABOLIC PANEL
BUN: 16 mg/dL (ref 6–23)
GFR calc non Af Amer: 35 mL/min — ABNORMAL LOW (ref >90)
Glucose, Bld: 126 mg/dL — ABNORMAL HIGH (ref 70–99)
Potassium: 4.3 mEq/L (ref 3.5–5.1)

## 2013-03-08 LAB — CBC
HCT: 28 % — ABNORMAL LOW (ref 36.0–46.0)
Hemoglobin: 9.1 g/dL — ABNORMAL LOW (ref 12.0–15.0)
MCH: 31.3 pg (ref 26.0–34.0)
MCHC: 32.5 g/dL (ref 30.0–36.0)

## 2013-03-08 MED ORDER — ALUM & MAG HYDROXIDE-SIMETH 200-200-20 MG/5ML PO SUSP
30.0000 mL | ORAL | Status: DC | PRN
  Administered 2013-03-08: 30 mL via ORAL
  Filled 2013-03-08: qty 30

## 2013-03-08 NOTE — Progress Notes (Signed)
Physical Therapy Treatment Patient Details Name: Victoria Rodgers MRN: [id number] DOB: 1927/07/24 Today's Date: 03/08/2013 Time: 0865-7846 PT Time Calculation (min): 26 min  PT Assessment / Plan / Recommendation  History of Present Illness RTKA on 03/06/13   PT Comments   Pt has hiccups and has become nauseated but tolerated ambulating in hall and there ex. Plans SNF tomorrow.  Follow Up Recommendations  SNF     Does the patient have the potential to tolerate intense rehabilitation     Barriers to Discharge        Equipment Recommendations  None recommended by PT    Recommendations for Other Services    Frequency 7X/week   Progress towards PT Goals Progress towards PT goals: Progressing toward goals  Plan Current plan remains appropriate    Precautions / Restrictions Precautions Precautions: Knee   Pertinent Vitals/Pain 5 R knee, ice applied. Had meds.    Mobility  Bed Mobility Supine to Sit: 4: Min guard Details for Bed Mobility Assistance: support of RLE off of bed  Transfers Sit to Stand: 4: Min guard;From bed;With armrests;From chair/3-in-1;With upper extremity assist;From elevated surface Stand to Sit: 5: Supervision;With upper extremity assist;With armrests;To bed;To chair/3-in-1 Details for Transfer Assistance: verbal cues for safety sit to stand from [facility]. Pt recalled to place RLE forward, support under R leg for sitting without KI Ambulation/Gait Ambulation/Gait Assistance: 4: Min guard Ambulation Distance (Feet): 100 Feet Assistive device: Rolling walker Ambulation/Gait Assistance Details: verbal cues for sequence, step length. Gait Pattern: Step-to pattern Gait velocity: decr.    Exercises Total Joint Exercises Quad Sets: AROM;Right;Supine;10 reps Short Arc Quad: AROM;Right;10 reps;Supine Heel Slides: AAROM;Right;10 reps;Supine Straight Leg Raises: AAROM;Right;10 reps;Supine Goniometric ROM: 10-50 R knee   PT Diagnosis:    PT Problem List:   PT  Treatment Interventions:     PT Goals (current goals can now be found in the care plan section)    Visit Information  Last PT Received On: 03/08/13 Assistance Needed: +1 History of Present Illness: RTKA on 03/06/13    Subjective Data      Cognition  Cognition Arousal/Alertness: Awake/alert    Balance     End of Session PT - End of Session Activity Tolerance: Patient tolerated treatment well Patient left: in bed;with call bell/phone within reach;with family/visitor present Nurse Communication: Mobility status   GP     Rada Hay 03/08/2013, 1:52 PM

## 2013-03-08 NOTE — Progress Notes (Signed)
   Subjective: 2 Days Post-Op Procedure(s) (LRB): RIGHT TOTAL KNEE ARTHROPLASTY (Right) Patient reports pain as mild.   Patient seen in rounds with Dr. Lequita Halt. Patient is well, and has had no acute complaints or problems Plan is to go Skilled nursing facility after hospital stay.  Objective: Vital signs in last 24 hours: Temp:  [97.5 F (36.4 C)-98.6 F (37 C)] 98.6 F (37 C) (09/17 0627) Pulse Rate:  [79-93] 93 (09/17 0627) Resp:  [16] 16 (09/17 0627) BP: (155-181)/(68-80) 176/78 mmHg (09/17 0627) SpO2:  [92 %-98 %] 92 % (09/17 0627)  Intake/Output from previous day:  Intake/Output Summary (Last 24 hours) at 03/08/13 0720 Last data filed at 03/08/13 0500  Gross per 24 hour  Intake    480 ml  Output    950 ml  Net   -470 ml    Intake/Output this shift:    Labs:  Recent Labs  03/07/13 0506 03/08/13 0445  HGB 9.1* 9.1*    Recent Labs  03/07/13 0506 03/08/13 0445  WBC 8.9 9.4  RBC 3.06* 2.91*  HCT 29.5* 28.0*  PLT 227 204    Recent Labs  03/07/13 0506 03/08/13 0445  NA 138 135  K 3.9 4.3  CL 106 104  CO2 23 22  BUN 15 16  CREATININE 1.36* 1.35*  GLUCOSE 149* 126*  CALCIUM 8.0* 8.0*   No results found for this basename: LABPT, INR,  in the last 72 hours  EXAM General - Patient is Alert, Appropriate and Oriented Extremity - Neurovascular intact Sensation intact distally Dorsiflexion/Plantar flexion intact Dressing/Incision - clean, dry, no drainage Motor Function - intact, moving foot and toes well on exam.   Past Medical History  Diagnosis Date  . Arthritis   . Bruises easily   . GERD (gastroesophageal reflux disease)     takes Nexium daily  . Anxiety     takes Celexa daily  . Hypertension     takes Benicar and Maxzide daily  . Hypothyroidism     takes Synthroid daily  . SS (spinal stenosis)   . Chronic colitis   . Chronic cystitis     STRESS CYSTITIS  . Osteoporosis   . Iron deficiency anemia SECONDARY TO KIDNEY DISEASE--   FOLLOWED BY DR Cathie Beams  . Anemia in chronic kidney disease   . B12 deficiency anemia   . Chronic kidney disease     chronic renal insufficiency    Assessment/Plan: 2 Days Post-Op Procedure(s) (LRB): RIGHT TOTAL KNEE ARTHROPLASTY (Right) Principal Problem:   OA (osteoarthritis) of knee Active Problems:   Anemia in chronic kidney disease   GERD (gastroesophageal reflux disease)   Unspecified hypothyroidism   Unspecified essential hypertension  Estimated body mass index is 27.23 kg/(m^2) as calculated from the following:   Height as of this encounter: 5\' 6"  (1.676 m).   Weight as of this encounter: 76.476 kg (168 lb 9.6 oz). Up with therapy Plan for discharge tomorrow Discharge to SNF  DVT Prophylaxis - Lovenox for total of 10 days and then ASA 325 mg daily for three weeks, and then a baby ASA Weight-Bearing as tolerated to right leg  Victoria Rodgers 03/08/2013, 7:20 AM

## 2013-03-09 LAB — CBC
HCT: 25.3 % — ABNORMAL LOW (ref 36.0–46.0)
Hemoglobin: 8 g/dL — ABNORMAL LOW (ref 12.0–15.0)
MCH: 30.2 pg (ref 26.0–34.0)
MCHC: 31.6 g/dL (ref 30.0–36.0)
MCV: 95.5 fL (ref 78.0–100.0)

## 2013-03-09 MED ORDER — METHYLPREDNISOLONE 4 MG PO KIT: 4.0000 mg | PACK | Freq: Three times a day (TID) | ORAL | Status: DC

## 2013-03-09 MED ORDER — METHYLPREDNISOLONE 4 MG PO KIT: 8.0000 mg | PACK | Freq: Every evening | ORAL | Status: DC

## 2013-03-09 MED ORDER — METHYLPREDNISOLONE 4 MG PO KIT
8.0000 mg | PACK | Freq: Every morning | ORAL | Status: AC
  Administered 2013-03-09: 8 mg via ORAL
  Filled 2013-03-09: qty 21

## 2013-03-09 MED ORDER — ONDANSETRON HCL 4 MG PO TABS: 4.0000 mg | ORAL_TABLET | Freq: Four times a day (QID) | ORAL | Status: AC | PRN

## 2013-03-09 MED ORDER — METHYLPREDNISOLONE 4 MG PO KIT: 4.0000 mg | PACK | ORAL | Status: DC

## 2013-03-09 MED ORDER — DIPHENHYDRAMINE HCL 12.5 MG/5ML PO ELIX: 12.5000 mg | ORAL_SOLUTION | ORAL | Status: AC | PRN

## 2013-03-09 MED ORDER — DSS 100 MG PO CAPS: 100.0000 mg | ORAL_CAPSULE | Freq: Two times a day (BID) | ORAL | Status: AC

## 2013-03-09 MED ORDER — METHYLPREDNISOLONE 4 MG PO KIT: 4.0000 mg | PACK | Freq: Four times a day (QID) | ORAL | Status: DC

## 2013-03-09 MED ORDER — ALUM & MAG HYDROXIDE-SIMETH 200-200-20 MG/5ML PO SUSP: 30.0000 mL | ORAL | Status: AC | PRN

## 2013-03-09 MED ORDER — METHYLPREDNISOLONE 4 MG PO KIT: PACK | ORAL | Status: AC

## 2013-03-09 MED ORDER — METOCLOPRAMIDE HCL 5 MG PO TABS: 5.0000 mg | ORAL_TABLET | Freq: Three times a day (TID) | ORAL | Status: AC | PRN

## 2013-03-09 MED ORDER — ENOXAPARIN SODIUM 30 MG/0.3ML ~~LOC~~ SOLN: 30.0000 mg | Freq: Two times a day (BID) | SUBCUTANEOUS | Status: AC

## 2013-03-09 MED ORDER — TRAMADOL HCL 50 MG PO TABS: 50.0000 mg | ORAL_TABLET | Freq: Four times a day (QID) | ORAL | Status: AC | PRN

## 2013-03-09 MED ORDER — HYDROCODONE-ACETAMINOPHEN 5-325 MG PO TABS: 1.0000 | ORAL_TABLET | ORAL | Status: DC | PRN

## 2013-03-09 MED ORDER — HYDROCODONE-ACETAMINOPHEN 5-325 MG PO TABS: 1.0000 | ORAL_TABLET | ORAL | Status: AC | PRN

## 2013-03-09 MED ORDER — BISACODYL 10 MG RE SUPP: 10.0000 mg | Freq: Every day | RECTAL | Status: AC | PRN

## 2013-03-09 MED ORDER — METHOCARBAMOL 500 MG PO TABS: 500.0000 mg | ORAL_TABLET | Freq: Four times a day (QID) | ORAL | Status: AC | PRN

## 2013-03-09 MED ORDER — POLYETHYLENE GLYCOL 3350 17 G PO PACK: 17.0000 g | PACK | Freq: Every day | ORAL | Status: AC | PRN

## 2013-03-09 NOTE — Progress Notes (Signed)
Report called to michele at [facility], patient ready for discharge. Sharrell Ku RN

## 2013-03-09 NOTE — Discharge Summary (Signed)
Physician Discharge Summary   Patient ID: Victoria Rodgers MRN: [id number] DOB/AGE: Jul 07, 1927 79 y.o.  Admit date: 03/06/2013 Discharge date: 03/09/2013  Primary Diagnosis:  Osteoarthritis Right knee(s)  Admission Diagnoses:  Past Medical History  Diagnosis Date  . Arthritis   . Bruises easily   . GERD (gastroesophageal reflux disease)     takes Nexium daily  . Anxiety     takes Celexa daily  . Hypertension     takes Benicar and Maxzide daily  . Hypothyroidism     takes Synthroid daily  . SS (spinal stenosis)   . Chronic colitis   . Chronic cystitis     STRESS CYSTITIS  . Osteoporosis   . Iron deficiency anemia SECONDARY TO KIDNEY DISEASE--  FOLLOWED BY DR Cathie Beams  . Anemia in chronic kidney disease   . B12 deficiency anemia   . Chronic kidney disease     chronic renal insufficiency   Discharge Diagnoses:   Principal Problem:   OA (osteoarthritis) of knee Active Problems:   Anemia in chronic kidney disease   GERD (gastroesophageal reflux disease)   Unspecified hypothyroidism   Unspecified essential hypertension  Estimated body mass index is 27.23 kg/(m^2) as calculated from the following:   Height as of this encounter: 5\' 6"  (1.676 m).   Weight as of this encounter: 76.476 kg (168 lb 9.6 oz).  Procedure:  Procedure(s) (LRB): RIGHT TOTAL KNEE ARTHROPLASTY (Right)   Consults: None  HPI: Victoria Rodgers is a 79 y.o. year old female with end stage OA of her right knee with progressively worsening pain and dysfunction. She has constant pain, with activity and at rest and significant functional deficits with difficulties even with ADLs. She has had extensive non-op management including analgesics, injections of cortisone and viscosupplements, and home exercise program, but remains in significant pain with significant dysfunction.Radiographs show bone on bone arthritis lateral and patellofemoral. She presents now for right Total Knee Arthroplasty.   Laboratory  Data: Admission on 03/06/2013  Component Date Value Range Status  . WBC 03/07/2013 8.9  4.0 - 10.5 K/uL Final  . RBC 03/07/2013 3.06* 3.87 - 5.11 MIL/uL Final  . Hemoglobin 03/07/2013 9.1* 12.0 - 15.0 g/dL Final  . HCT 47/82/9562 29.5* 36.0 - 46.0 % Final  . MCV 03/07/2013 96.4  78.0 - 100.0 fL Final  . MCH 03/07/2013 29.7  26.0 - 34.0 pg Final  . MCHC 03/07/2013 30.8  30.0 - 36.0 g/dL Final  . RDW 13/01/6577 15.3  11.5 - 15.5 % Final  . Platelets 03/07/2013 227  150 - 400 K/uL Final  . Sodium 03/07/2013 138  135 - 145 mEq/L Final  . Potassium 03/07/2013 3.9  3.5 - 5.1 mEq/L Final  . Chloride 03/07/2013 106  96 - 112 mEq/L Final  . CO2 03/07/2013 23  19 - 32 mEq/L Final  . Glucose, Bld 03/07/2013 149* 70 - 99 mg/dL Final  . BUN 46/96/2952 15  6 - 23 mg/dL Final  . Creatinine, Ser 03/07/2013 1.36* 0.50 - 1.10 mg/dL Final  . Calcium 84/13/2440 8.0* 8.4 - 10.5 mg/dL Final  . GFR calc non Af Amer 03/07/2013 35* >90 mL/min Final  . GFR calc Af Amer 03/07/2013 40* >90 mL/min Final   Comment: (NOTE)                          The eGFR has been calculated using the CKD EPI equation.  This calculation has not been validated in all clinical situations.                          eGFR's persistently <90 mL/min signify possible Chronic Kidney                          Disease.  . WBC 03/08/2013 9.4  4.0 - 10.5 K/uL Final  . RBC 03/08/2013 2.91* 3.87 - 5.11 MIL/uL Final  . Hemoglobin 03/08/2013 9.1* 12.0 - 15.0 g/dL Final  . HCT 16/03/9603 28.0* 36.0 - 46.0 % Final  . MCV 03/08/2013 96.2  78.0 - 100.0 fL Final  . MCH 03/08/2013 31.3  26.0 - 34.0 pg Final  . MCHC 03/08/2013 32.5  30.0 - 36.0 g/dL Final  . RDW 54/02/8118 15.5  11.5 - 15.5 % Final  . Platelets 03/08/2013 204  150 - 400 K/uL Final  . Sodium 03/08/2013 135  135 - 145 mEq/L Final  . Potassium 03/08/2013 4.3  3.5 - 5.1 mEq/L Final  . Chloride 03/08/2013 104  96 - 112 mEq/L Final  . CO2 03/08/2013 22  19 - 32  mEq/L Final  . Glucose, Bld 03/08/2013 126* 70 - 99 mg/dL Final  . BUN 14/78/2956 16  6 - 23 mg/dL Final  . Creatinine, Ser 03/08/2013 1.35* 0.50 - 1.10 mg/dL Final  . Calcium 21/30/8657 8.0* 8.4 - 10.5 mg/dL Final  . GFR calc non Af Amer 03/08/2013 35* >90 mL/min Final  . GFR calc Af Amer 03/08/2013 41* >90 mL/min Final   Comment: (NOTE)                          The eGFR has been calculated using the CKD EPI equation.                          This calculation has not been validated in all clinical situations.                          eGFR's persistently <90 mL/min signify possible Chronic Kidney                          Disease.  . WBC 03/09/2013 6.2  4.0 - 10.5 K/uL Final  . RBC 03/09/2013 2.65* 3.87 - 5.11 MIL/uL Final  . Hemoglobin 03/09/2013 8.0* 12.0 - 15.0 g/dL Final  . HCT 84/69/6295 25.3* 36.0 - 46.0 % Final  . MCV 03/09/2013 95.5  78.0 - 100.0 fL Final  . MCH 03/09/2013 30.2  26.0 - 34.0 pg Final  . MCHC 03/09/2013 31.6  30.0 - 36.0 g/dL Final  . RDW 28/41/3244 15.4  11.5 - 15.5 % Final  . Platelets 03/09/2013 166  150 - 400 K/uL Final  Hospital Outpatient Visit on 02/28/2013  Component Date Value Range Status  . MRSA, PCR 02/28/2013 NEGATIVE  NEGATIVE Final  . Staphylococcus aureus 02/28/2013 NEGATIVE  NEGATIVE Final   Comment:                                 The Xpert SA Assay (FDA  approved for NASAL specimens                          in patients over 71 years of age),                          is one component of                          a comprehensive surveillance                          program.  Test performance has                          been validated by [employer] for patients greater                          than or equal to 45 year old.                          It is not intended                          to diagnose infection nor to                          guide or monitor treatment.  Marland Kitchen aPTT 02/28/2013  26  24 - 37 seconds Final  . WBC 02/28/2013 6.0  4.0 - 10.5 K/uL Final  . RBC 02/28/2013 3.17* 3.87 - 5.11 MIL/uL Final  . Hemoglobin 02/28/2013 9.5* 12.0 - 15.0 g/dL Final  . HCT 19/14/7829 29.9* 36.0 - 46.0 % Final  . MCV 02/28/2013 94.3  78.0 - 100.0 fL Final  . MCH 02/28/2013 30.0  26.0 - 34.0 pg Final  . MCHC 02/28/2013 31.8  30.0 - 36.0 g/dL Final  . RDW 56/21/3086 14.2  11.5 - 15.5 % Final  . Platelets 02/28/2013 228  150 - 400 K/uL Final  . Sodium 02/28/2013 137  135 - 145 mEq/L Final  . Potassium 02/28/2013 4.1  3.5 - 5.1 mEq/L Final  . Chloride 02/28/2013 105  96 - 112 mEq/L Final  . CO2 02/28/2013 24  19 - 32 mEq/L Final  . Glucose, Bld 02/28/2013 94  70 - 99 mg/dL Final  . BUN 57/84/6962 22  6 - 23 mg/dL Final  . Creatinine, Ser 02/28/2013 1.37* 0.50 - 1.10 mg/dL Final  . Calcium 95/28/4132 9.5  8.4 - 10.5 mg/dL Final  . Total Protein 02/28/2013 6.6  6.0 - 8.3 g/dL Final  . Albumin 44/06/270 3.7  3.5 - 5.2 g/dL Final  . Rodgers 53/66/4403 15  0 - 37 U/L Final  . ALT 02/28/2013 7  0 - 35 U/L Final  . Alkaline Phosphatase 02/28/2013 52  39 - 117 U/L Final  . Total Bilirubin 02/28/2013 0.3  0.3 - 1.2 mg/dL Final  . GFR calc non Af Amer 02/28/2013 35* >90 mL/min Final  . GFR calc Af Amer 02/28/2013 40* >90 mL/min Final   Comment: (NOTE)  The eGFR has been calculated using the CKD EPI equation.                          This calculation has not been validated in all clinical situations.                          eGFR's persistently <90 mL/min signify possible Chronic Kidney                          Disease.  Marland Kitchen Prothrombin Time 02/28/2013 13.2  11.6 - 15.2 seconds Final  . INR 02/28/2013 1.02  0.00 - 1.49 Final  . ABO/RH(D) 02/28/2013 O POS   Final  . Antibody Screen 02/28/2013 NEG   Final  . Sample Expiration 02/28/2013 03/09/2013   Final  . Color, Urine 02/28/2013 YELLOW  YELLOW Final  . APPearance 02/28/2013 CLOUDY* CLEAR Final  . Specific Gravity,  Urine 02/28/2013 1.025  1.005 - 1.030 Final  . pH 02/28/2013 5.0  5.0 - 8.0 Final  . Glucose, UA 02/28/2013 NEGATIVE  NEGATIVE mg/dL Final  . Hgb urine dipstick 02/28/2013 NEGATIVE  NEGATIVE Final  . Bilirubin Urine 02/28/2013 NEGATIVE  NEGATIVE Final  . Ketones, ur 02/28/2013 NEGATIVE  NEGATIVE mg/dL Final  . Protein, ur 40/98/1191 NEGATIVE  NEGATIVE mg/dL Final  . Urobilinogen, UA 02/28/2013 0.2  0.0 - 1.0 mg/dL Final  . Nitrite 47/82/9562 NEGATIVE  NEGATIVE Final  . Leukocytes, UA 02/28/2013 NEGATIVE  NEGATIVE Final   MICROSCOPIC NOT DONE ON URINES WITH NEGATIVE PROTEIN, BLOOD, LEUKOCYTES, NITRITE, OR GLUCOSE <1000 mg/dL.     X-Rays:Dg Chest 2 View  02/28/2013   *RADIOLOGY REPORT*  Clinical Data: Hypertension, preoperative for total knee osteoplasty.  CHEST - 2 VIEW  Comparison: April 13, 2006  Findings: There is no focal infiltrate, pulmonary edema, or pleural effusion.  The aorta is tortuous.  Heart size is normal.  The soft tissues and osseous structures are stable.  IMPRESSION: No acute cardiopulmonary disease identified.   Original Report Authenticated By: Sherian Rein, M.D.    EKG: Orders placed during the hospital encounter of 02/28/13  . EKG 12-LEAD  . EKG 12-LEAD     Hospital Course: Victoria Rodgers is a 79 y.o. who was admitted to [facility]. They were brought to the operating room on 03/06/2013 and underwent Procedure(s): RIGHT TOTAL KNEE ARTHROPLASTY.  Patient tolerated the procedure well and was later transferred to the recovery room and then to the orthopaedic floor for postoperative care.  They were given PO and IV analgesics for pain control following their surgery.  They were given 24 hours of postoperative antibiotics of  Anti-infectives   Start     Dose/Rate Route Frequency Ordered Stop   03/06/13 1800  ceFAZolin (ANCEF) IVPB 1 g/50 mL premix     1 g 100 mL/hr over 30 Minutes Intravenous Every 6 hours 03/06/13 1502 03/07/13 0004   03/06/13 0845   ceFAZolin (ANCEF) IVPB 2 g/50 mL premix     2 g 100 mL/hr over 30 Minutes Intravenous On call to O.R. 03/06/13 0840 03/06/13 1133     and started on DVT prophylaxis in the form of Lovenox.   PT and OT were ordered for total joint protocol.  Discharge planning consulted to help with postop disposition and equipment needs. Social worker consulted to assist with placement postop.  She [facility] to [facility] [place]. Patient had a tough night on the evening of surgery but better the next day.  They started to get up OOB with therapy on day one and walked 100 feet. Hemovac drain was pulled without difficulty.  Continued to work with therapy into day two walking 100 feet again.  Dressing was changed on day two and the incision was healing well.  By day three, the patient had progressed with therapy and meeting their goals. Unfortunately, she developed a gout flare in her right ankle and was started on a dosepack. Her HGB was low at 8.0 but she has anemia of chronic disease and will just watch it.  Will have [facility] recheck her count on Monday and send the results to the office. Incision was healing well.  Patient was seen in rounds and was ready to go to [facility] in [place].   Discharge Medications: Prior to Admission medications   Medication Sig Start Date End Date Taking? Authorizing Provider  acetaminophen (TYLENOL) 500 MG tablet Take 1,000 mg by mouth 2 (two) times daily.    Yes Historical Provider, MD  escitalopram (LEXAPRO) 10 MG tablet Take 10 mg by mouth every morning.   Yes Historical Provider, MD  esomeprazole (NEXIUM) 20 MG capsule Take 20 mg by mouth 2 (two) times daily.   Yes Historical Provider, MD  levothyroxine (SYNTHROID, LEVOTHROID) 88 MCG tablet Take 88 mcg by mouth every morning.    Yes Historical Provider, MD  olmesartan (BENICAR) 40 MG tablet Take 10 mg by mouth every morning. PT TAKES 1/4 TABLET DAILY FOR 10 MG   Yes Historical Provider, MD    triamterene-hydrochlorothiazide (MAXZIDE-25) 37.5-25 MG per tablet Take 0.5 tablets by mouth every morning.   Yes Historical Provider, MD  alum & mag hydroxide-simeth (MAALOX/MYLANTA) 200-200-20 MG/5ML suspension Take 30 mLs by mouth every 4 (four) hours as needed. 03/09/13   Adison Jerger Julien Girt, PA-C  bisacodyl (DULCOLAX) 10 MG suppository Place 1 suppository (10 mg total) rectally daily as needed. 03/09/13   Joua Bake Julien Girt, PA-C  diphenhydrAMINE (BENADRYL) 12.5 MG/5ML elixir Take 5-10 mLs (12.5-25 mg total) by mouth every 4 (four) hours as needed for itching. 03/09/13   Mayerli Kirst, PA-C  docusate sodium 100 MG CAPS Take 100 mg by mouth 2 (two) times daily. 03/09/13   Cinsere Mizrahi, PA-C  enoxaparin (LOVENOX) 30 MG/0.3ML injection Inject 0.3 mLs (30 mg total) into the skin every 12 (twelve) hours. Lovenox injections for 8 more days to complete a 10 day treatment plan. Once completed the Lovenox injections, start a 325 mg aspirin daily for three weeks, and then a baby aspirin 81 mg daily. 03/09/13   Amoreena Neubert Julien Girt, PA-C  HYDROcodone-acetaminophen (NORCO/VICODIN) 5-325 MG per tablet Take 1-2 tablets by mouth every 4 (four) hours as needed. 03/09/13   Nolan Tuazon Julien Girt, PA-C  methocarbamol (ROBAXIN) 500 MG tablet Take 1 tablet (500 mg total) by mouth every 6 (six) hours as needed. 03/09/13   Manahil Vanzile, PA-C  methylPREDNISolone (MEDROL DOSEPAK) 4 MG tablet Take as directed.  Tapering dose pack. 03/09/13   Maren Wiesen Julien Girt, PA-C  metoCLOPramide (REGLAN) 5 MG tablet Take 1 tablet (5 mg total) by mouth every 8 (eight) hours as needed (if ondansetron (ZOFRAN) ineffective.). 03/09/13   Brick Ketcher, PA-C  ondansetron (ZOFRAN) 4 MG tablet Take 1 tablet (4 mg total) by mouth every 6 (six) hours as needed for nausea. 03/09/13   Adaya Garmany, PA-C  polyethylene glycol (MIRALAX / GLYCOLAX) packet Take 17 g by mouth daily  as needed. 03/09/13   Pheonix Wisby, PA-C   traMADol (ULTRAM) 50 MG tablet Take 1-2 tablets (50-100 mg total) by mouth every 6 (six) hours as needed (mild pain). 03/09/13   Micharl Helmes Julien Girt, PA-C   LABS - Please recheck CBC on Monday 03/13/2013 and send results to Dr. Deri Fuelling office fax [phone].   Medrol Dosepack for gout flare up started prior to discharge. Continue dosepack at [facility].   Lovenox for a total of 10 days (eight more days to go).  Once the patient has completed the Lovenox, the patient should start 325 mg Aspirin for three weeks.  After completing the 325 mg aspirin, then reduce to a baby 81 mg Aspirin.   When discharged from the skilled rehab facility, please have the facility set up the patient's Home Health Physical Therapy prior to being released. Also provide the patient with their medications at time of release from the facility to include their pain medication, the muscle relaxants, and their blood thinner medication. If the patient is still at the rehab facility at time of follow up appointment, please also assist the patient in arranging follow up appointment in our office and any transportation needs.  Diet: Cardiac diet Activity:WBAT Follow-up:in 2 weeks Disposition - Skilled nursing facility - [facility] in [place] Discharged Condition: stable   Discharge Orders   Future Orders Complete By Expires   Call MD / Call 911  As directed    Comments:     If you experience chest pain or shortness of breath, CALL 911 and be transported to the hospital emergency room.  If you develope a fever above 101 F, pus (white drainage) or increased drainage or redness at the wound, or calf pain, call your surgeon's office.   Change dressing  As directed    Comments:     Change dressing daily with sterile 4 x 4 inch gauze dressing and apply TED hose. Do not submerge the incision under water.   Constipation Prevention  As directed    Comments:     Drink plenty of fluids.  Prune juice may be helpful.  You may use a  stool softener, such as Colace (over the counter) 100 mg twice a day.  Use MiraLax (over the counter) for constipation as needed.   Diet - low sodium heart healthy  As directed    Discharge instructions  As directed    Comments:     Pick up stool softner and laxative for home. Do not submerge incision under water. May shower. Continue to use ice for pain and swelling from surgery.  Lovenox injections for a total of 10 days (eight more days to go).  After completion of the Lovenox, start a full dose Aspirin 325 mg daily for three weeks, and then reduce to a baby aspirin 81 mg daily.   Do not put a pillow under the knee. Place it under the heel.  As directed    Do not sit on low chairs, stoools or toilet seats, as it may be difficult to get up from low surfaces  As directed    Driving restrictions  As directed    Comments:     No driving until released by the physician.   Increase activity slowly as tolerated  As directed    Lifting restrictions  As directed    Comments:     No lifting until released by the physician.   Patient may shower  As directed    Comments:  You may shower without a dressing once there is no drainage.  Do not wash over the wound.  If drainage remains, do not shower until drainage stops.   TED hose  As directed    Comments:     Use stockings (TED hose) for 3 weeks on both leg(s).  You may remove them at night for sleeping.   Weight bearing as tolerated  As directed        Medication List    STOP taking these medications       CALTRATE 600 PO     estrogens (conjugated) 0.3 MG tablet  Commonly known as:  PREMARIN     PROCRIT IJ      TAKE these medications       acetaminophen 500 MG tablet  Commonly known as:  TYLENOL  Take 1,000 mg by mouth 2 (two) times daily.     alum & mag hydroxide-simeth 200-200-20 MG/5ML suspension  Commonly known as:  MAALOX/MYLANTA  Take 30 mLs by mouth every 4 (four) hours as needed.     bisacodyl 10 MG suppository    Commonly known as:  DULCOLAX  Place 1 suppository (10 mg total) rectally daily as needed.     diphenhydrAMINE 12.5 MG/5ML elixir  Commonly known as:  BENADRYL  Take 5-10 mLs (12.5-25 mg total) by mouth every 4 (four) hours as needed for itching.     DSS 100 MG Caps  Take 100 mg by mouth 2 (two) times daily.     enoxaparin 30 MG/0.3ML injection  Commonly known as:  LOVENOX  - Inject 0.3 mLs (30 mg total) into the skin every 12 (twelve) hours. Lovenox injections for 8 more days to complete a 10 day treatment plan.  - Once completed the Lovenox injections, start a 325 mg aspirin daily for three weeks, and then a baby aspirin 81 mg daily.     escitalopram 10 MG tablet  Commonly known as:  LEXAPRO  Take 10 mg by mouth every morning.     esomeprazole 20 MG capsule  Commonly known as:  NEXIUM  Take 20 mg by mouth 2 (two) times daily.     HYDROcodone-acetaminophen 5-325 MG per tablet  Commonly known as:  NORCO/VICODIN  Take 1-2 tablets by mouth every 4 (four) hours as needed.     levothyroxine 88 MCG tablet  Commonly known as:  SYNTHROID, LEVOTHROID  Take 88 mcg by mouth every morning.     methocarbamol 500 MG tablet  Commonly known as:  ROBAXIN  Take 1 tablet (500 mg total) by mouth every 6 (six) hours as needed.     methylPREDNISolone 4 MG tablet  Commonly known as:  MEDROL DOSEPAK  Take as directed.  Tapering dose pack.     metoCLOPramide 5 MG tablet  Commonly known as:  REGLAN  Take 1 tablet (5 mg total) by mouth every 8 (eight) hours as needed (if ondansetron (ZOFRAN) ineffective.).     olmesartan 40 MG tablet  Commonly known as:  BENICAR  Take 10 mg by mouth every morning. PT TAKES 1/4 TABLET DAILY FOR 10 MG     ondansetron 4 MG tablet  Commonly known as:  ZOFRAN  Take 1 tablet (4 mg total) by mouth every 6 (six) hours as needed for nausea.     polyethylene glycol packet  Commonly known as:  MIRALAX / GLYCOLAX  Take 17 g by mouth daily as needed.     traMADol  50 MG tablet  Commonly known  as:  ULTRAM  Take 1-2 tablets (50-100 mg total) by mouth every 6 (six) hours as needed (mild pain).     triamterene-hydrochlorothiazide 37.5-25 MG per tablet  Commonly known as:  MAXZIDE-25  Take 0.5 tablets by mouth every morning.           Follow-up Information   Follow up with Loanne Drilling, MD. Schedule an appointment as soon as possible for a visit in 2 weeks.   Specialty:  Orthopedic Surgery   Contact information:   [address] [place] [address] [phone]       Signed: Patrica Duel 03/09/2013, 8:27 AM

## 2013-03-09 NOTE — Progress Notes (Signed)
Subjective: 3 Days Post-Op Procedure(s) (LRB): RIGHT TOTAL KNEE ARTHROPLASTY (Right) Patient reports pain as moderate in her right ankle.  Feels like its a gout flare. Will start her on a medrol dosepack.  Plan is to go to [facility].  HGB is down to 8.0.  Will see how she does with therapy.  She wants to hold off on any blood and will just watch her counts.   Patient seen in rounds with Dr. Lequita Halt. Patient is having problems with gout flare in ankle Patient is ready to go to [facility] in [place] later today.  Objective: Vital signs in last 24 hours: Temp:  [98.5 F (36.9 C)-99.2 F (37.3 C)] 99.2 F (37.3 C) (09/18 0655) Pulse Rate:  [80-96] 96 (09/18 0655) Resp:  [16-20] 16 (09/18 0655) BP: (117-172)/(73-77) 164/77 mmHg (09/18 0655) SpO2:  [95 %-99 %] 96 % (09/18 0655)  Intake/Output from previous day:  Intake/Output Summary (Last 24 hours) at 03/09/13 0756 Last data filed at 03/08/13 1300  Gross per 24 hour  Intake    480 ml  Output      0 ml  Net    480 ml    Intake/Output this shift:    Labs:  Recent Labs  03/07/13 0506 03/08/13 0445 03/09/13 0513  HGB 9.1* 9.1* 8.0*    Recent Labs  03/08/13 0445 03/09/13 0513  WBC 9.4 6.2  RBC 2.91* 2.65*  HCT 28.0* 25.3*  PLT 204 166    Recent Labs  03/07/13 0506 03/08/13 0445  NA 138 135  K 3.9 4.3  CL 106 104  CO2 23 22  BUN 15 16  CREATININE 1.36* 1.35*  GLUCOSE 149* 126*  CALCIUM 8.0* 8.0*   No results found for this basename: LABPT, INR,  in the last 72 hours  EXAM: General - Patient is Alert, Appropriate and Oriented Extremity - Neurovascular intact Sensation intact distally Dorsiflexion/Plantar flexion intact Incision - clean, dry, no drainage, healing Motor Function - intact, moving foot and toes well on exam. Some swelling noted in right ankle.  Some pain with movement.  Assessment/Plan: 3 Days Post-Op Procedure(s) (LRB): RIGHT TOTAL KNEE ARTHROPLASTY (Right) Procedure(s)  (LRB): RIGHT TOTAL KNEE ARTHROPLASTY (Right) Past Medical History  Diagnosis Date  . Arthritis   . Bruises easily   . GERD (gastroesophageal reflux disease)     takes Nexium daily  . Anxiety     takes Celexa daily  . Hypertension     takes Benicar and Maxzide daily  . Hypothyroidism     takes Synthroid daily  . SS (spinal stenosis)   . Chronic colitis   . Chronic cystitis     STRESS CYSTITIS  . Osteoporosis   . Iron deficiency anemia SECONDARY TO KIDNEY DISEASE--  FOLLOWED BY DR Cathie Beams  . Anemia in chronic kidney disease   . B12 deficiency anemia   . Chronic kidney disease     chronic renal insufficiency   Principal Problem:   OA (osteoarthritis) of knee Active Problems:   Anemia in chronic kidney disease   GERD (gastroesophageal reflux disease)   Unspecified hypothyroidism   Unspecified essential hypertension  Estimated body mass index is 27.23 kg/(m^2) as calculated from the following:   Height as of this encounter: 5\' 6"  (1.676 m).   Weight as of this encounter: 76.476 kg (168 lb 9.6 oz). Up with therapy Discharge to SNF Diet - Cardiac diet Follow up - in 2 weeks Activity - WBAT Disposition - Skilled nursing facility Condition  Upon Discharge - Stable D/C Meds - See DC Summary DVT Prophylaxis - Lovenox for total of 10 days and then ASA 325 mg daily for three weeks, and then a baby ASA  Shaney Deckman 03/09/2013, 7:56 AM

## 2013-03-09 NOTE — Progress Notes (Signed)
Pt returned to [facility] today for ST Rehab. Family provided transportation to facility.  Cori Razor LCSW

## 2013-03-10 NOTE — Care Management Note (Signed)
    Page 1 of 1   03/10/2013     12:03:24 PM   CARE MANAGEMENT NOTE 03/10/2013  Patient:  Victoria Rodgers, Victoria Rodgers   Account Number:  [id number]  Date Initiated:  03/09/2013  Documentation initiated by:  Colleen Can  Subjective/Objective Assessment:   dx total right knee replacemnt     Action/Plan:   SNF rehab   Anticipated DC Date:     Anticipated DC Plan:  SKILLED NURSING FACILITY  In-house referral  Clinical Social Worker      DC Planning Services  CM consult      Choice offered to / List presented to:             Status of service:  Completed, signed off Medicare Important Message given?  NA - LOS <3 / Initial given by admissions (If response is "NO", the following Medicare IM given date fields will be blank) Date Medicare IM given:   Date Additional Medicare IM given:    Discharge Disposition:  SKILLED NURSING FACILITY  Per UR Regulation:    If discussed at Long Length of Stay Meetings, dates discussed:    Comments:

## 2013-03-22 ENCOUNTER — Ambulatory Visit: Payer: Self-pay | Admitting: Oncology

## 2013-03-27 ENCOUNTER — Ambulatory Visit: Payer: Self-pay | Admitting: Oncology

## 2013-03-27 LAB — CANCER CENTER HEMOGLOBIN: HGB: 9.5 g/dL — ABNORMAL LOW (ref 12.0–16.0)

## 2013-03-30 ENCOUNTER — Observation Stay: Payer: Self-pay | Admitting: Internal Medicine

## 2013-03-30 LAB — BASIC METABOLIC PANEL
Anion Gap: 7 (ref 7–16)
Co2: 23 mmol/L (ref 21–32)
Glucose: 95 mg/dL (ref 65–99)
Potassium: 4.5 mmol/L (ref 3.5–5.1)
Sodium: 134 mmol/L — ABNORMAL LOW (ref 136–145)

## 2013-03-30 LAB — CBC
HCT: 28.5 % — ABNORMAL LOW (ref 35.0–47.0)
HGB: 9.6 g/dL — ABNORMAL LOW (ref 12.0–16.0)
MCH: 30.6 pg (ref 26.0–34.0)
MCV: 91 fL (ref 80–100)
Platelet: 319 10*3/uL (ref 150–440)

## 2013-03-30 LAB — CK TOTAL AND CKMB (NOT AT ARMC)
CK-MB: <0.5 ng/mL — ABNORMAL LOW (ref 0.5–3.6)
CK-MB: <0.5 ng/mL — ABNORMAL LOW (ref 0.5–3.6)

## 2013-03-31 ENCOUNTER — Telehealth: Payer: Self-pay

## 2013-03-31 DIAGNOSIS — R079 Chest pain, unspecified: Secondary | ICD-10-CM

## 2013-03-31 LAB — BASIC METABOLIC PANEL
BUN: 16 mg/dL (ref 7–18)
Calcium, Total: 8.9 mg/dL (ref 8.5–10.1)
Co2: 23 mmol/L (ref 21–32)
Osmolality: 269 (ref 275–301)
Potassium: 4.5 mmol/L (ref 3.5–5.1)

## 2013-03-31 LAB — CBC WITH DIFFERENTIAL/PLATELET
Basophil %: 0.9 %
Eosinophil #: 0.2 10*3/uL (ref 0.0–0.7)
HCT: 26.9 % — ABNORMAL LOW (ref 35.0–47.0)
Lymphocyte #: 1.1 10*3/uL (ref 1.0–3.6)
Lymphocyte %: 19.8 %
MCHC: 33.3 g/dL (ref 32.0–36.0)
MCV: 91 fL (ref 80–100)
Monocyte %: 15.2 %
Neutrophil #: 3.5 10*3/uL (ref 1.4–6.5)
RBC: 2.95 10*6/uL — ABNORMAL LOW (ref 3.80–5.20)
RDW: 14.8 % — ABNORMAL HIGH (ref 11.5–14.5)

## 2013-03-31 NOTE — Telephone Encounter (Signed)
Spoke w/ husband, as pt is still in hospital, to be discharged today.   He is aware that lexiscan myoview is sched for 04/06/13 @ 7:30 at [facility]. Mailed letter with time and instructions to pt home.

## 2013-04-22 ENCOUNTER — Ambulatory Visit: Payer: Self-pay | Admitting: Oncology

## 2013-05-22 ENCOUNTER — Ambulatory Visit: Payer: Self-pay | Admitting: Oncology

## 2013-05-22 LAB — CBC CANCER CENTER
Basophil #: 0 x10 3/mm (ref 0.0–0.1)
Basophil %: 0.8 %
Eosinophil %: 2.1 %
HCT: 30 % — ABNORMAL LOW (ref 35.0–47.0)
HGB: 9.5 g/dL — ABNORMAL LOW (ref 12.0–16.0)
Lymphocyte #: 1 x10 3/mm (ref 1.0–3.6)
Monocyte #: 0.5 x10 3/mm (ref 0.2–0.9)
Monocyte %: 9.7 %
Neutrophil #: 3.7 x10 3/mm (ref 1.4–6.5)
Platelet: 232 x10 3/mm (ref 150–440)
RBC: 3.28 10*6/uL — ABNORMAL LOW (ref 3.80–5.20)
RDW: 15.6 % — ABNORMAL HIGH (ref 11.5–14.5)

## 2013-06-13 ENCOUNTER — Emergency Department: Payer: Self-pay | Admitting: Emergency Medicine

## 2013-06-13 LAB — CBC WITH DIFFERENTIAL/PLATELET
Basophil %: 0.5 %
Eosinophil #: 0 10*3/uL (ref 0.0–0.7)
HCT: 29.4 % — ABNORMAL LOW (ref 35.0–47.0)
HGB: 9.5 g/dL — ABNORMAL LOW (ref 12.0–16.0)
Lymphocyte #: 0.4 10*3/uL — ABNORMAL LOW (ref 1.0–3.6)
MCH: 29.2 pg (ref 26.0–34.0)
MCV: 90 fL (ref 80–100)
Platelet: 198 10*3/uL (ref 150–440)
RBC: 3.26 10*6/uL — ABNORMAL LOW (ref 3.80–5.20)

## 2013-06-13 LAB — COMPREHENSIVE METABOLIC PANEL
Albumin: 3.3 g/dL — ABNORMAL LOW (ref 3.4–5.0)
Alkaline Phosphatase: 54 U/L
Anion Gap: 5 — ABNORMAL LOW (ref 7–16)
Chloride: 108 mmol/L — ABNORMAL HIGH (ref 98–107)
Creatinine: 1.21 mg/dL (ref 0.60–1.30)
EGFR (African American): 48 — ABNORMAL LOW
EGFR (Non-African Amer.): 41 — ABNORMAL LOW
Glucose: 110 mg/dL — ABNORMAL HIGH (ref 65–99)
Potassium: 4 mmol/L (ref 3.5–5.1)
SGPT (ALT): 15 U/L (ref 12–78)

## 2013-06-15 ENCOUNTER — Observation Stay: Payer: Self-pay | Admitting: Internal Medicine

## 2013-06-15 LAB — CBC
MCV: 89 fL (ref 80–100)
Platelet: 191 10*3/uL (ref 150–440)
RDW: 16.6 % — ABNORMAL HIGH (ref 11.5–14.5)
WBC: 5.2 10*3/uL (ref 3.6–11.0)

## 2013-06-15 LAB — TROPONIN I: Troponin-I: <0.02 ng/mL

## 2013-06-15 LAB — COMPREHENSIVE METABOLIC PANEL
Anion Gap: 5 — ABNORMAL LOW (ref 7–16)
Bilirubin,Total: 0.3 mg/dL (ref 0.2–1.0)
Calcium, Total: 8.1 mg/dL — ABNORMAL LOW (ref 8.5–10.1)
Co2: 25 mmol/L (ref 21–32)
Creatinine: 1.27 mg/dL (ref 0.60–1.30)
SGPT (ALT): 16 U/L (ref 12–78)
Total Protein: 5.9 g/dL — ABNORMAL LOW (ref 6.4–8.2)

## 2013-06-15 LAB — CK: CK, Total: 102 U/L (ref 21–215)

## 2013-06-16 LAB — URINALYSIS, COMPLETE
Bilirubin,UR: NEGATIVE
Blood: NEGATIVE
Ketone: NEGATIVE
Leukocyte Esterase: NEGATIVE
Ph: 5 (ref 4.5–8.0)
Protein: NEGATIVE
RBC,UR: <1 /HPF (ref 0–5)
Specific Gravity: 1.006 (ref 1.003–1.030)
WBC UR: NONE SEEN /HPF (ref 0–5)

## 2013-06-22 ENCOUNTER — Ambulatory Visit: Payer: Self-pay | Admitting: Oncology

## 2013-07-04 LAB — CANCER CENTER HEMOGLOBIN: HGB: 9.1 g/dL — ABNORMAL LOW (ref 12.0–16.0)

## 2013-07-17 LAB — CANCER CENTER HEMOGLOBIN: HGB: 9.6 g/dL — AB (ref 12.0–16.0)

## 2013-07-23 ENCOUNTER — Ambulatory Visit: Payer: Self-pay | Admitting: Oncology

## 2013-08-14 LAB — CANCER CENTER HEMOGLOBIN: HGB: 9.8 g/dL — AB (ref 12.0–16.0)

## 2013-08-14 LAB — CBC CANCER CENTER
BASOS ABS: 0 x10 3/mm (ref 0.0–0.1)
Basophil %: 0.5 %
EOS ABS: 0.1 x10 3/mm (ref 0.0–0.7)
EOS PCT: 0.6 %
HCT: 31.8 % — AB (ref 35.0–47.0)
Lymphocyte #: 1.1 x10 3/mm (ref 1.0–3.6)
Lymphocyte %: 12.6 %
MCH: 27.7 pg (ref 26.0–34.0)
MCHC: 30.7 g/dL — AB (ref 32.0–36.0)
MCV: 90 fL (ref 80–100)
MONO ABS: 0.7 x10 3/mm (ref 0.2–0.9)
Monocyte %: 7.6 %
Neutrophil #: 7 x10 3/mm — ABNORMAL HIGH (ref 1.4–6.5)
Neutrophil %: 78.7 %
Platelet: 297 x10 3/mm (ref 150–440)
RBC: 3.52 10*6/uL — ABNORMAL LOW (ref 3.80–5.20)
RDW: 16.6 % — ABNORMAL HIGH (ref 11.5–14.5)
WBC: 8.9 x10 3/mm (ref 3.6–11.0)

## 2013-08-14 LAB — FERRITIN: Ferritin (ARMC): 55 ng/mL (ref 8–388)

## 2013-08-14 LAB — IRON AND TIBC
IRON SATURATION: 31 %
Iron Bind.Cap.(Total): 301 ug/dL (ref 250–450)
Iron: 93 ug/dL (ref 50–170)
Unbound Iron-Bind.Cap.: 208 ug/dL

## 2013-08-20 ENCOUNTER — Ambulatory Visit: Payer: Self-pay | Admitting: Oncology

## 2013-09-11 LAB — CANCER CENTER HEMOGLOBIN: HGB: 9.7 g/dL — ABNORMAL LOW (ref 12.0–16.0)

## 2013-09-20 ENCOUNTER — Ambulatory Visit: Payer: Self-pay | Admitting: Oncology

## 2013-10-09 LAB — CANCER CENTER HEMOGLOBIN: HGB: 10 g/dL — AB (ref 12.0–16.0)

## 2013-10-20 ENCOUNTER — Ambulatory Visit: Payer: Self-pay | Admitting: Oncology

## 2013-11-06 LAB — CBC CANCER CENTER
Basophil #: 0 x10 3/mm (ref 0.0–0.1)
Basophil %: 0.8 %
EOS PCT: 2.9 %
Eosinophil #: 0.2 x10 3/mm (ref 0.0–0.7)
HCT: 29.8 % — ABNORMAL LOW (ref 35.0–47.0)
HGB: 10 g/dL — ABNORMAL LOW (ref 12.0–16.0)
Lymphocyte #: 1.5 x10 3/mm (ref 1.0–3.6)
Lymphocyte %: 26.9 %
MCH: 29.3 pg (ref 26.0–34.0)
MCHC: 33.5 g/dL (ref 32.0–36.0)
MCV: 88 fL (ref 80–100)
MONO ABS: 0.5 x10 3/mm (ref 0.2–0.9)
MONOS PCT: 8.5 %
Neutrophil #: 3.4 x10 3/mm (ref 1.4–6.5)
Neutrophil %: 60.9 %
PLATELETS: 228 x10 3/mm (ref 150–440)
RBC: 3.4 10*6/uL — AB (ref 3.80–5.20)
RDW: 15 % — AB (ref 11.5–14.5)
WBC: 5.6 x10 3/mm (ref 3.6–11.0)

## 2013-11-20 ENCOUNTER — Ambulatory Visit: Payer: Self-pay | Admitting: Oncology

## 2013-11-28 ENCOUNTER — Ambulatory Visit: Payer: Self-pay | Admitting: Family Medicine

## 2013-12-07 ENCOUNTER — Ambulatory Visit: Payer: Self-pay | Admitting: Oncology

## 2013-12-07 LAB — CANCER CENTER HEMOGLOBIN: HGB: 9.8 g/dL — ABNORMAL LOW (ref 12.0–16.0)

## 2013-12-20 ENCOUNTER — Ambulatory Visit: Payer: Self-pay | Admitting: Oncology

## 2014-01-05 LAB — CANCER CENTER HEMOGLOBIN: HGB: 9.8 g/dL — ABNORMAL LOW (ref 12.0–16.0)

## 2014-01-06 IMAGING — CR DG CHEST 2V
1 series · 2 of 2 positions shown · non-contrast
Comparison: none

REASON FOR EXAM: Chest Pain
COMMENTS:

PROCEDURE:     DXR - DXR CHEST PA (OR AP) AND LATERAL  - March 30, 2013  [DATE]
RESULT:     The lungs are clear. The heart and pulmonary vessels are normal.
The bony and mediastinal structures are unremarkable. There is no effusion.
There is no pneumothorax or evidence of congestive failure.

[Series 1: pa · 0.17mm/px · 2 of 2 slices shown]
[im 1/2]
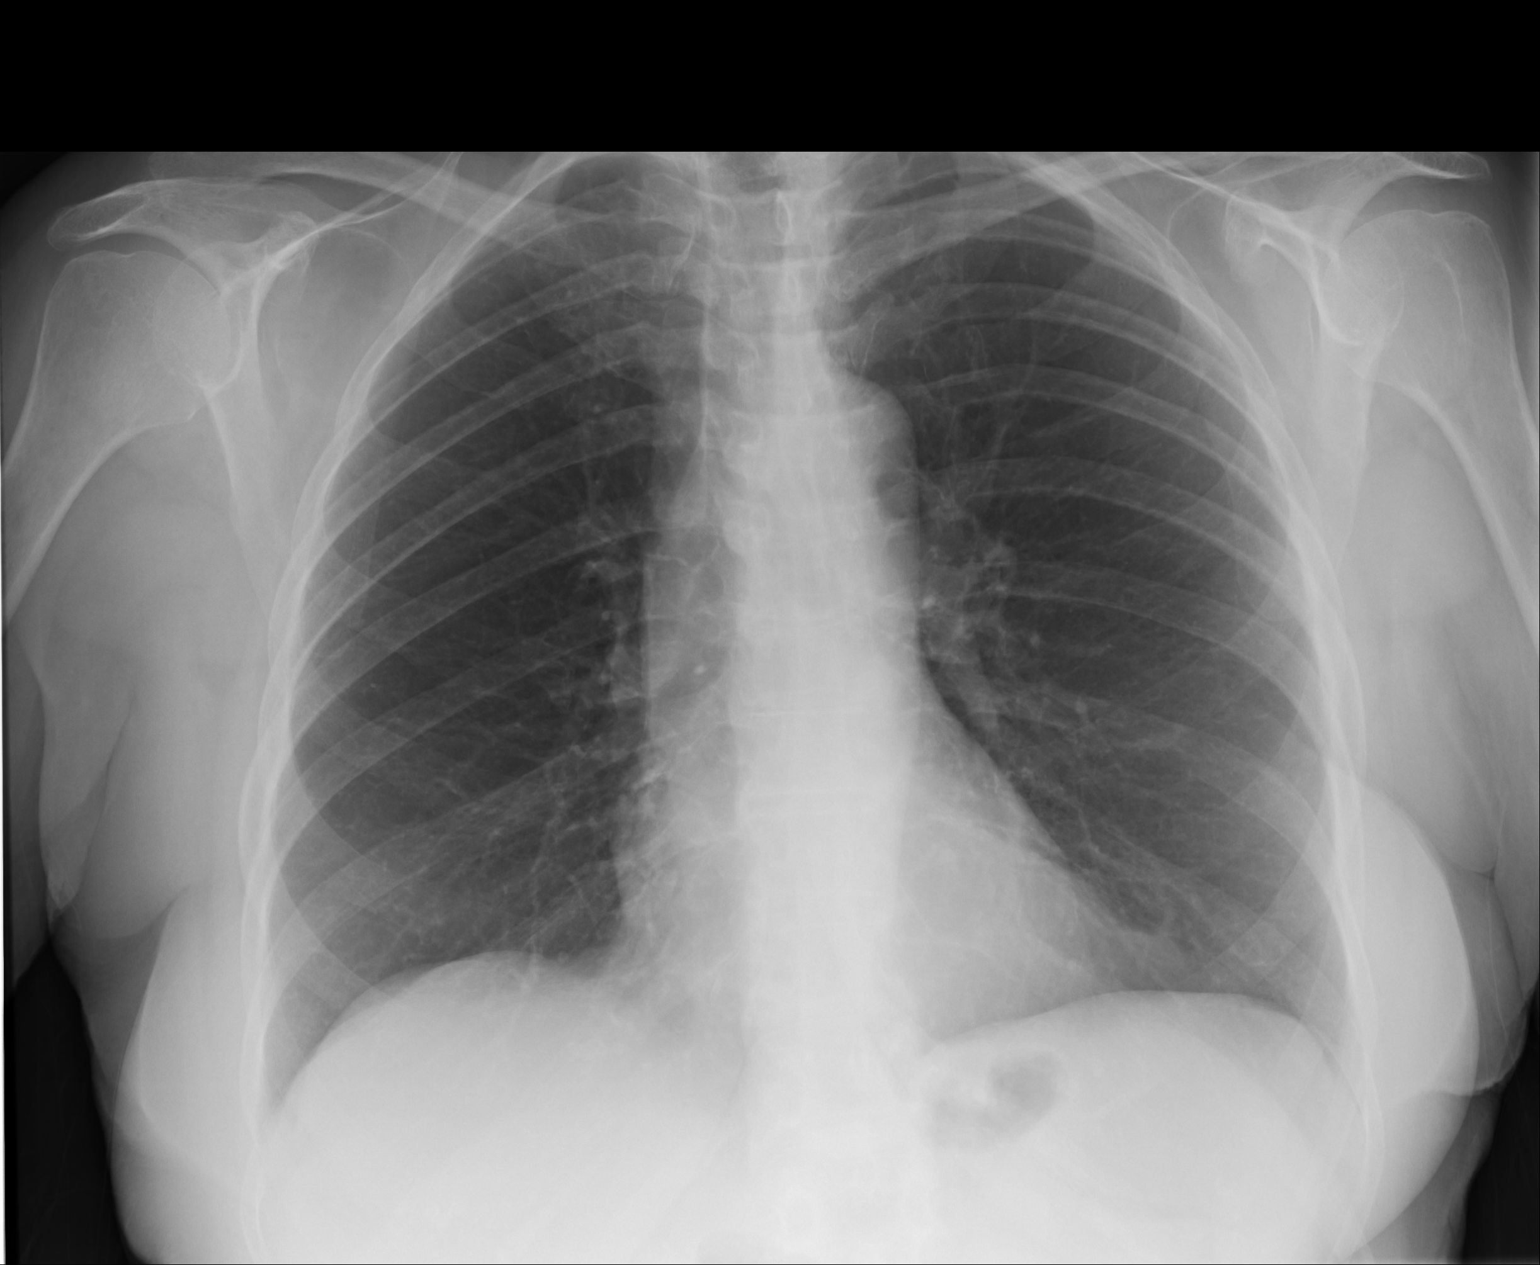
[im 2/2]
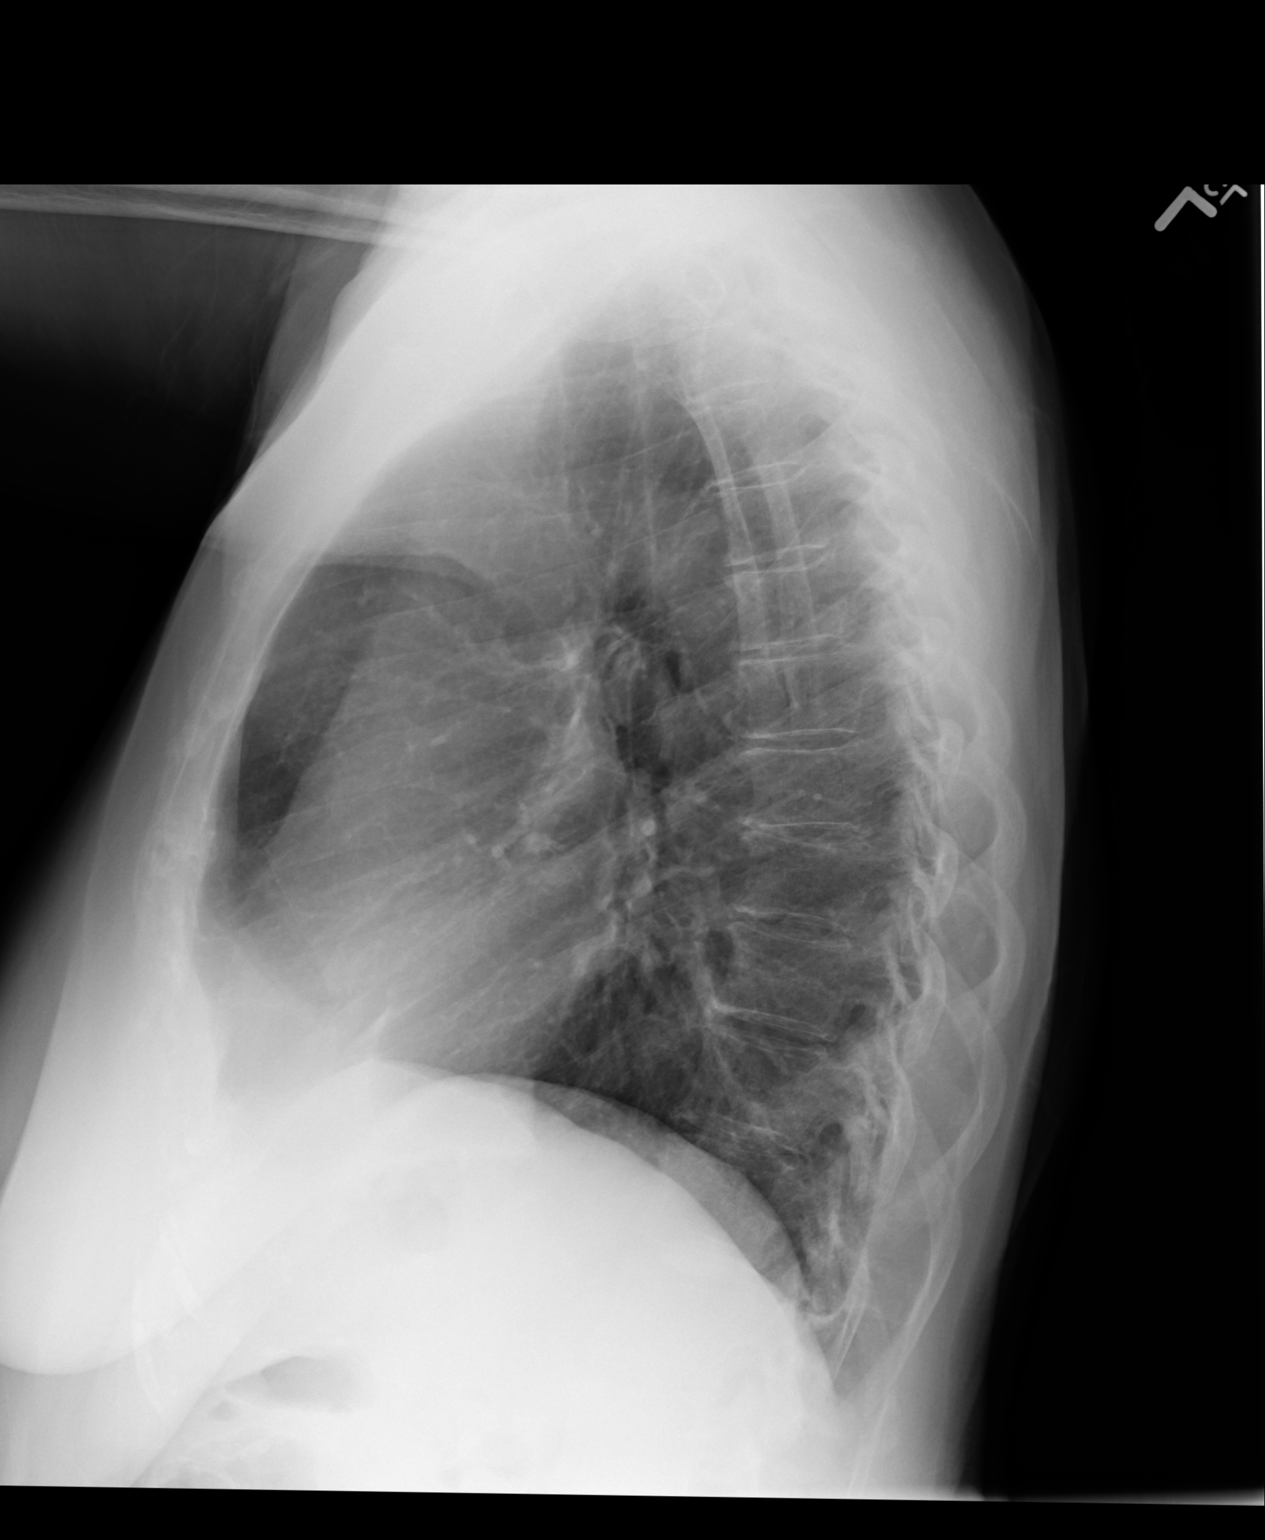

[2 of 2 positions shown; findings below may reference images not displayed]

IMPRESSION: No acute cardiopulmonary disease.

[REDACTED]

## 2014-01-20 ENCOUNTER — Ambulatory Visit: Payer: Self-pay | Admitting: Oncology

## 2014-01-20 ENCOUNTER — Ambulatory Visit: Payer: Self-pay | Admitting: Internal Medicine

## 2014-01-29 LAB — CANCER CENTER HEMOGLOBIN: HGB: 10 g/dL — AB (ref 12.0–16.0)

## 2014-02-03 ENCOUNTER — Inpatient Hospital Stay: Payer: Self-pay | Admitting: Internal Medicine

## 2014-02-03 LAB — COMPREHENSIVE METABOLIC PANEL WITH GFR
Albumin: 3.4 g/dL
Alkaline Phosphatase: 75 U/L
Anion Gap: 13
BUN: 11 mg/dL
Bilirubin,Total: 0.6 mg/dL
Calcium, Total: 8.8 mg/dL
Chloride: 95 mmol/L — ABNORMAL LOW
Co2: 22 mmol/L
Creatinine: 1.51 mg/dL — ABNORMAL HIGH
EGFR (African American): 36 — ABNORMAL LOW
EGFR (Non-African Amer.): 31 — ABNORMAL LOW
Glucose: 129 mg/dL — ABNORMAL HIGH
Osmolality: 262
Potassium: 3.9 mmol/L
SGOT(AST): 29 U/L
SGPT (ALT): 15 U/L
Sodium: 130 mmol/L — ABNORMAL LOW
Total Protein: 7 g/dL

## 2014-02-03 LAB — PROTIME-INR
INR: 1
Prothrombin Time: 12.9 s

## 2014-02-03 LAB — CBC WITH DIFFERENTIAL/PLATELET
Basophil #: 0 x10 3/mm 3
Basophil %: 0.4 %
Eosinophil #: 0 x10 3/mm 3
Eosinophil %: 0.4 %
HCT: 34.5 % — ABNORMAL LOW
HGB: 11 g/dL — ABNORMAL LOW
Lymphocyte %: 7.8 %
Lymphs Abs: 0.8 x10 3/mm 3 — ABNORMAL LOW
MCH: 28.9 pg
MCHC: 32 g/dL
MCV: 90 fL
Monocyte #: 0.8 "x10 3/mm "
Monocyte %: 7.6 %
Neutrophil #: 8.5 x10 3/mm 3 — ABNORMAL HIGH
Neutrophil %: 83.8 %
Platelet: 303 x10 3/mm 3
RBC: 3.81 X10 6/mm 3
RDW: 15.2 % — ABNORMAL HIGH
WBC: 10.1 x10 3/mm 3

## 2014-02-03 LAB — TROPONIN I
TROPONIN-I: 4.1 ng/mL — AB
TROPONIN-I: 3.2 ng/mL — AB
Troponin-I: 3.5 ng/mL — ABNORMAL HIGH

## 2014-02-03 LAB — URINALYSIS, COMPLETE
BACTERIA: NONE SEEN
Glucose,UR: NEGATIVE mg/dL (ref 0–75)
Hyaline Cast: 2
Ketone: NEGATIVE
Leukocyte Esterase: NEGATIVE
NITRITE: NEGATIVE
PH: 5 (ref 4.5–8.0)
Protein: NEGATIVE
Specific Gravity: 1.016 (ref 1.003–1.030)
Squamous Epithelial: 5

## 2014-02-03 LAB — CK-MB
CK-MB: 11.5 ng/mL — ABNORMAL HIGH
CK-MB: 10.8 ng/mL — ABNORMAL HIGH
CK-MB: 9.3 ng/mL — ABNORMAL HIGH (ref 0.5–3.6)

## 2014-02-03 LAB — APTT: ACTIVATED PTT: 27.1 s (ref 23.6–35.9)

## 2014-02-04 DIAGNOSIS — I214 Non-ST elevation (NSTEMI) myocardial infarction: Secondary | ICD-10-CM

## 2014-02-04 DIAGNOSIS — I059 Rheumatic mitral valve disease, unspecified: Secondary | ICD-10-CM

## 2014-02-04 DIAGNOSIS — N179 Acute kidney failure, unspecified: Secondary | ICD-10-CM

## 2014-02-04 DIAGNOSIS — I10 Essential (primary) hypertension: Secondary | ICD-10-CM

## 2014-02-04 DIAGNOSIS — I4891 Unspecified atrial fibrillation: Secondary | ICD-10-CM

## 2014-02-04 LAB — TSH: Thyroid Stimulating Horm: 1.32 u[IU]/mL

## 2014-02-04 LAB — CBC WITH DIFFERENTIAL/PLATELET
Basophil #: 0 10*3/uL (ref 0.0–0.1)
Basophil %: 0.2 %
Eosinophil #: 0 10*3/uL (ref 0.0–0.7)
Eosinophil %: 0.2 %
HCT: 32.1 % — AB (ref 35.0–47.0)
HGB: 10.5 g/dL — AB (ref 12.0–16.0)
LYMPHS ABS: 0.5 10*3/uL — AB (ref 1.0–3.6)
Lymphocyte %: 4.6 %
MCH: 29.4 pg (ref 26.0–34.0)
MCHC: 32.5 g/dL (ref 32.0–36.0)
MCV: 90 fL (ref 80–100)
MONO ABS: 0.8 x10 3/mm (ref 0.2–0.9)
MONOS PCT: 7.5 %
Neutrophil #: 9.5 10*3/uL — ABNORMAL HIGH (ref 1.4–6.5)
Neutrophil %: 87.5 %
Platelet: 255 10*3/uL (ref 150–440)
RBC: 3.56 10*6/uL — AB (ref 3.80–5.20)
RDW: 15.5 % — ABNORMAL HIGH (ref 11.5–14.5)
WBC: 10.8 10*3/uL (ref 3.6–11.0)

## 2014-02-04 LAB — BASIC METABOLIC PANEL
ANION GAP: 9 (ref 7–16)
Anion Gap: 13 (ref 7–16)
BUN: 14 mg/dL (ref 7–18)
BUN: 14 mg/dL (ref 7–18)
CALCIUM: 8.1 mg/dL — AB (ref 8.5–10.1)
CHLORIDE: 97 mmol/L — AB (ref 98–107)
CO2: 22 mmol/L (ref 21–32)
Calcium, Total: 8.2 mg/dL — ABNORMAL LOW (ref 8.5–10.1)
Chloride: 97 mmol/L — ABNORMAL LOW (ref 98–107)
Co2: 22 mmol/L (ref 21–32)
Creatinine: 1.46 mg/dL — ABNORMAL HIGH (ref 0.60–1.30)
Creatinine: 1.66 mg/dL — ABNORMAL HIGH (ref 0.60–1.30)
EGFR (African American): 32 — ABNORMAL LOW
EGFR (Non-African Amer.): 28 — ABNORMAL LOW
GFR CALC AF AMER: 38 — AB
GFR CALC NON AF AMER: 33 — AB
GLUCOSE: 142 mg/dL — AB (ref 65–99)
GLUCOSE: 146 mg/dL — AB (ref 65–99)
OSMOLALITY: 267 (ref 275–301)
OSMOLALITY: 260 (ref 275–301)
Potassium: 3.7 mmol/L (ref 3.5–5.1)
Potassium: 3.7 mmol/L (ref 3.5–5.1)
Sodium: 132 mmol/L — ABNORMAL LOW (ref 136–145)
Sodium: 128 mmol/L — ABNORMAL LOW (ref 136–145)

## 2014-02-04 LAB — LIPID PANEL
CHOLESTEROL: 182 mg/dL (ref 0–200)
HDL: 50 mg/dL (ref 40–60)
Ldl Cholesterol, Calc: 93 mg/dL (ref 0–100)
TRIGLYCERIDES: 194 mg/dL (ref 0–200)
VLDL Cholesterol, Calc: 39 mg/dL (ref 5–40)

## 2014-02-04 LAB — HEPARIN LEVEL (UNFRACTIONATED): ANTI-XA(UNFRACTIONATED): 0.39 [IU]/mL (ref 0.30–0.70)

## 2014-02-04 LAB — CLOSTRIDIUM DIFFICILE(ARMC)

## 2014-02-05 DIAGNOSIS — I498 Other specified cardiac arrhythmias: Secondary | ICD-10-CM

## 2014-02-05 LAB — BASIC METABOLIC PANEL
Anion Gap: 12 (ref 7–16)
BUN: 20 mg/dL — ABNORMAL HIGH (ref 7–18)
CALCIUM: 7.6 mg/dL — AB (ref 8.5–10.1)
CREATININE: 1.94 mg/dL — AB (ref 0.60–1.30)
Chloride: 93 mmol/L — ABNORMAL LOW (ref 98–107)
Co2: 20 mmol/L — ABNORMAL LOW (ref 21–32)
EGFR (African American): 27 — ABNORMAL LOW
EGFR (Non-African Amer.): 23 — ABNORMAL LOW
Glucose: 156 mg/dL — ABNORMAL HIGH (ref 65–99)
OSMOLALITY: 257 (ref 275–301)
POTASSIUM: 4.4 mmol/L (ref 3.5–5.1)
Sodium: 125 mmol/L — ABNORMAL LOW (ref 136–145)

## 2014-02-05 LAB — CBC WITH DIFFERENTIAL/PLATELET
BASOS PCT: 0.1 %
Basophil #: 0 10*3/uL (ref 0.0–0.1)
Basophil #: 0 10*3/uL (ref 0.0–0.1)
Basophil %: 0 %
EOS ABS: 0 10*3/uL (ref 0.0–0.7)
EOS ABS: 0 10*3/uL (ref 0.0–0.7)
EOS PCT: 0 %
EOS PCT: 0.1 %
HCT: 31.6 % — ABNORMAL LOW (ref 35.0–47.0)
HCT: 30.6 % — ABNORMAL LOW (ref 35.0–47.0)
HGB: 9.9 g/dL — ABNORMAL LOW (ref 12.0–16.0)
HGB: 9.4 g/dL — AB (ref 12.0–16.0)
LYMPHS ABS: 0.5 10*3/uL — AB (ref 1.0–3.6)
LYMPHS ABS: 1 10*3/uL (ref 1.0–3.6)
LYMPHS PCT: 3 %
LYMPHS PCT: 5 %
MCH: 28.5 pg (ref 26.0–34.0)
MCH: 28.3 pg (ref 26.0–34.0)
MCHC: 31.5 g/dL — ABNORMAL LOW (ref 32.0–36.0)
MCHC: 30.7 g/dL — ABNORMAL LOW (ref 32.0–36.0)
MCV: 91 fL (ref 80–100)
MCV: 92 fL (ref 80–100)
MONO ABS: 2.2 x10 3/mm — AB (ref 0.2–0.9)
MONO ABS: 0.6 x10 3/mm (ref 0.2–0.9)
MONOS PCT: 3.3 %
Monocyte %: 12.4 %
NEUTROS ABS: 15.2 10*3/uL — AB (ref 1.4–6.5)
NEUTROS PCT: 84.6 %
Neutrophil #: 18 10*3/uL — ABNORMAL HIGH (ref 1.4–6.5)
Neutrophil %: 91.5 %
PLATELETS: 282 10*3/uL (ref 150–440)
Platelet: 259 10*3/uL (ref 150–440)
RBC: 3.49 10*6/uL — ABNORMAL LOW (ref 3.80–5.20)
RBC: 3.32 10*6/uL — ABNORMAL LOW (ref 3.80–5.20)
RDW: 15.5 % — ABNORMAL HIGH (ref 11.5–14.5)
RDW: 15.7 % — AB (ref 11.5–14.5)
WBC: 18 10*3/uL — ABNORMAL HIGH (ref 3.6–11.0)
WBC: 19.7 10*3/uL — ABNORMAL HIGH (ref 3.6–11.0)

## 2014-02-05 LAB — HEPARIN LEVEL (UNFRACTIONATED): Anti-Xa(Unfractionated): 0.63 IU/mL (ref 0.30–0.70)

## 2014-02-05 LAB — COMPREHENSIVE METABOLIC PANEL
ALBUMIN: 2.8 g/dL — AB (ref 3.4–5.0)
ALT: 65 U/L — AB
AST: 103 U/L — AB (ref 15–37)
Alkaline Phosphatase: 64 U/L
Anion Gap: 20 — ABNORMAL HIGH (ref 7–16)
BUN: 27 mg/dL — AB (ref 7–18)
Bilirubin,Total: 2.8 mg/dL — ABNORMAL HIGH (ref 0.2–1.0)
CALCIUM: 6.9 mg/dL — AB (ref 8.5–10.1)
CHLORIDE: 90 mmol/L — AB (ref 98–107)
Co2: 13 mmol/L — ABNORMAL LOW (ref 21–32)
Creatinine: 2.96 mg/dL — ABNORMAL HIGH (ref 0.60–1.30)
EGFR (African American): 16 — ABNORMAL LOW
GFR CALC NON AF AMER: 14 — AB
Glucose: 160 mg/dL — ABNORMAL HIGH (ref 65–99)
Osmolality: 256 (ref 275–301)
Potassium: 4.2 mmol/L (ref 3.5–5.1)
Sodium: 123 mmol/L — ABNORMAL LOW (ref 136–145)
Total Protein: 5.8 g/dL — ABNORMAL LOW (ref 6.4–8.2)

## 2014-02-06 LAB — CBC WITH DIFFERENTIAL/PLATELET
BASOS PCT: 0.1 %
Basophil #: 0 10*3/uL (ref 0.0–0.1)
Eosinophil #: 0 10*3/uL (ref 0.0–0.7)
Eosinophil %: 0.1 %
HCT: 31.4 % — ABNORMAL LOW (ref 35.0–47.0)
HGB: 9.9 g/dL — ABNORMAL LOW (ref 12.0–16.0)
Lymphocyte #: 0.6 10*3/uL — ABNORMAL LOW (ref 1.0–3.6)
Lymphocyte %: 6 %
MCH: 28.8 pg (ref 26.0–34.0)
MCHC: 31.5 g/dL — ABNORMAL LOW (ref 32.0–36.0)
MCV: 91 fL (ref 80–100)
Monocyte #: 0.4 x10 3/mm (ref 0.2–0.9)
Monocyte %: 3.9 %
NEUTROS ABS: 8.3 10*3/uL — AB (ref 1.4–6.5)
Neutrophil %: 89.9 %
Platelet: 249 10*3/uL (ref 150–440)
RBC: 3.44 10*6/uL — AB (ref 3.80–5.20)
RDW: 15.3 % — ABNORMAL HIGH (ref 11.5–14.5)
WBC: 9.3 10*3/uL (ref 3.6–11.0)

## 2014-02-06 LAB — PHOSPHORUS: PHOSPHORUS: 3.4 mg/dL (ref 2.5–4.9)

## 2014-02-06 LAB — BASIC METABOLIC PANEL
ANION GAP: 16 (ref 7–16)
BUN: 29 mg/dL — AB (ref 7–18)
CHLORIDE: 88 mmol/L — AB (ref 98–107)
CO2: 20 mmol/L — AB (ref 21–32)
Calcium, Total: 6.8 mg/dL — CL (ref 8.5–10.1)
Creatinine: 2.81 mg/dL — ABNORMAL HIGH (ref 0.60–1.30)
EGFR (African American): 17 — ABNORMAL LOW
GFR CALC NON AF AMER: 15 — AB
GLUCOSE: 120 mg/dL — AB (ref 65–99)
OSMOLALITY: 257 (ref 275–301)
Potassium: 3.5 mmol/L (ref 3.5–5.1)
SODIUM: 124 mmol/L — AB (ref 136–145)

## 2014-02-06 LAB — ALBUMIN: Albumin: 2.6 g/dL — ABNORMAL LOW (ref 3.4–5.0)

## 2014-02-06 LAB — MAGNESIUM: Magnesium: 1.1 mg/dL — ABNORMAL LOW

## 2014-02-06 LAB — HEPARIN LEVEL (UNFRACTIONATED): Anti-Xa(Unfractionated): 0.6 IU/mL (ref 0.30–0.70)

## 2014-02-07 LAB — COMPREHENSIVE METABOLIC PANEL
ALK PHOS: 55 U/L
Albumin: 2.3 g/dL — ABNORMAL LOW (ref 3.4–5.0)
Anion Gap: 15 (ref 7–16)
BUN: 39 mg/dL — ABNORMAL HIGH (ref 7–18)
Bilirubin,Total: 1.8 mg/dL — ABNORMAL HIGH (ref 0.2–1.0)
Calcium, Total: 6.5 mg/dL — CL (ref 8.5–10.1)
Chloride: 84 mmol/L — ABNORMAL LOW (ref 98–107)
Co2: 21 mmol/L (ref 21–32)
Creatinine: 3.33 mg/dL — ABNORMAL HIGH (ref 0.60–1.30)
EGFR (African American): 14 — ABNORMAL LOW
GFR CALC NON AF AMER: 12 — AB
Glucose: 142 mg/dL — ABNORMAL HIGH (ref 65–99)
OSMOLALITY: 254 (ref 275–301)
POTASSIUM: 3.9 mmol/L (ref 3.5–5.1)
SGOT(AST): 102 U/L — ABNORMAL HIGH (ref 15–37)
SGPT (ALT): 135 U/L — ABNORMAL HIGH
Sodium: 120 mmol/L — CL (ref 136–145)
Total Protein: 5.3 g/dL — ABNORMAL LOW (ref 6.4–8.2)

## 2014-02-07 LAB — MAGNESIUM: Magnesium: 1.8 mg/dL

## 2014-02-07 LAB — CBC WITH DIFFERENTIAL/PLATELET
BASOS PCT: 0.7 %
Basophil #: 0.1 10*3/uL (ref 0.0–0.1)
Eosinophil #: 0 10*3/uL (ref 0.0–0.7)
Eosinophil %: 0 %
HCT: 28.7 % — AB (ref 35.0–47.0)
HGB: 9.2 g/dL — ABNORMAL LOW (ref 12.0–16.0)
Lymphocyte #: 0.3 10*3/uL — ABNORMAL LOW (ref 1.0–3.6)
Lymphocyte %: 1.9 %
MCH: 28.5 pg (ref 26.0–34.0)
MCHC: 32 g/dL (ref 32.0–36.0)
MCV: 89 fL (ref 80–100)
MONO ABS: 0.8 x10 3/mm (ref 0.2–0.9)
MONOS PCT: 5.3 %
NEUTROS PCT: 92.1 %
Neutrophil #: 14.4 10*3/uL — ABNORMAL HIGH (ref 1.4–6.5)
PLATELETS: 232 10*3/uL (ref 150–440)
RBC: 3.22 10*6/uL — ABNORMAL LOW (ref 3.80–5.20)
RDW: 15.8 % — AB (ref 11.5–14.5)
WBC: 15.7 10*3/uL — AB (ref 3.6–11.0)

## 2014-02-07 LAB — PHOSPHORUS: Phosphorus: 4.4 mg/dL (ref 2.5–4.9)

## 2014-02-07 LAB — HEPARIN LEVEL (UNFRACTIONATED): ANTI-XA(UNFRACTIONATED): 0.37 [IU]/mL (ref 0.30–0.70)

## 2014-02-07 LAB — STOOL CULTURE

## 2014-02-08 ENCOUNTER — Telehealth: Payer: Self-pay | Admitting: *Deleted

## 2014-02-08 DIAGNOSIS — I5021 Acute systolic (congestive) heart failure: Secondary | ICD-10-CM

## 2014-02-08 LAB — CBC WITH DIFFERENTIAL/PLATELET
BASOS ABS: 0 10*3/uL (ref 0.0–0.1)
BASOS PCT: 0.1 %
EOS PCT: 0 %
Eosinophil #: 0 10*3/uL (ref 0.0–0.7)
HCT: 27.9 % — ABNORMAL LOW (ref 35.0–47.0)
HGB: 8.9 g/dL — ABNORMAL LOW (ref 12.0–16.0)
LYMPHS ABS: 0.3 10*3/uL — AB (ref 1.0–3.6)
Lymphocyte %: 1.9 %
MCH: 28.1 pg (ref 26.0–34.0)
MCHC: 31.9 g/dL — AB (ref 32.0–36.0)
MCV: 88 fL (ref 80–100)
Monocyte #: 1.2 x10 3/mm — ABNORMAL HIGH (ref 0.2–0.9)
Monocyte %: 6.6 %
NEUTROS ABS: 16.9 10*3/uL — AB (ref 1.4–6.5)
Neutrophil %: 91.4 %
Platelet: 279 10*3/uL (ref 150–440)
RBC: 3.18 10*6/uL — AB (ref 3.80–5.20)
RDW: 15.2 % — ABNORMAL HIGH (ref 11.5–14.5)
WBC: 18.5 10*3/uL — AB (ref 3.6–11.0)

## 2014-02-08 LAB — COMPREHENSIVE METABOLIC PANEL
Albumin: 2.1 g/dL — ABNORMAL LOW (ref 3.4–5.0)
Alkaline Phosphatase: 56 U/L
Anion Gap: 17 — ABNORMAL HIGH (ref 7–16)
BILIRUBIN TOTAL: 1.4 mg/dL — AB (ref 0.2–1.0)
BUN: 47 mg/dL — ABNORMAL HIGH (ref 7–18)
CALCIUM: 6.4 mg/dL — AB (ref 8.5–10.1)
CREATININE: 3.51 mg/dL — AB (ref 0.60–1.30)
Chloride: 81 mmol/L — ABNORMAL LOW (ref 98–107)
Co2: 21 mmol/L (ref 21–32)
EGFR (African American): 13 — ABNORMAL LOW
EGFR (Non-African Amer.): 11 — ABNORMAL LOW
Glucose: 171 mg/dL — ABNORMAL HIGH (ref 65–99)
Osmolality: 257 (ref 275–301)
Potassium: 3.6 mmol/L (ref 3.5–5.1)
SGOT(AST): 74 U/L — ABNORMAL HIGH (ref 15–37)
SGPT (ALT): 117 U/L — ABNORMAL HIGH
Sodium: 119 mmol/L — CL (ref 136–145)
Total Protein: 5.1 g/dL — ABNORMAL LOW (ref 6.4–8.2)

## 2014-02-08 LAB — HEPARIN LEVEL (UNFRACTIONATED)
Anti-Xa(Unfractionated): 0.26 IU/mL — ABNORMAL LOW (ref 0.30–0.70)
Anti-Xa(Unfractionated): 0.49 IU/mL (ref 0.30–0.70)

## 2014-02-08 LAB — PHOSPHORUS: Phosphorus: 4.5 mg/dL (ref 2.5–4.9)

## 2014-02-08 LAB — MAGNESIUM: Magnesium: 2 mg/dL

## 2014-02-08 NOTE — Telephone Encounter (Signed)
Please call Mr. Bannan. He is concerned about his wife who is in ICU at [facility].

## 2014-02-08 NOTE — Telephone Encounter (Signed)
Dr. Kirke CorinArida paged, as he is on call at hospital.

## 2014-02-09 DIAGNOSIS — I5021 Acute systolic (congestive) heart failure: Secondary | ICD-10-CM

## 2014-02-09 LAB — CBC WITH DIFFERENTIAL/PLATELET
BASOS PCT: 0.1 %
Basophil #: 0 10*3/uL (ref 0.0–0.1)
EOS ABS: 0.1 10*3/uL (ref 0.0–0.7)
Eosinophil %: 0.3 %
HCT: 26.7 % — ABNORMAL LOW (ref 35.0–47.0)
HGB: 9 g/dL — ABNORMAL LOW (ref 12.0–16.0)
Lymphocyte #: 0.7 10*3/uL — ABNORMAL LOW (ref 1.0–3.6)
Lymphocyte %: 3.6 %
MCH: 29.1 pg (ref 26.0–34.0)
MCHC: 33.5 g/dL (ref 32.0–36.0)
MCV: 87 fL (ref 80–100)
MONO ABS: 1.6 x10 3/mm — AB (ref 0.2–0.9)
Monocyte %: 8.7 %
Neutrophil #: 16.1 10*3/uL — ABNORMAL HIGH (ref 1.4–6.5)
Neutrophil %: 87.3 %
PLATELETS: 258 10*3/uL (ref 150–440)
RBC: 3.08 10*6/uL — ABNORMAL LOW (ref 3.80–5.20)
RDW: 15.6 % — AB (ref 11.5–14.5)
WBC: 18.5 10*3/uL — AB (ref 3.6–11.0)

## 2014-02-09 LAB — PHOSPHORUS: Phosphorus: 4.2 mg/dL (ref 2.5–4.9)

## 2014-02-09 LAB — BASIC METABOLIC PANEL
Anion Gap: 15 (ref 7–16)
BUN: 54 mg/dL — ABNORMAL HIGH (ref 7–18)
CHLORIDE: 82 mmol/L — AB (ref 98–107)
Calcium, Total: 6.2 mg/dL — CL (ref 8.5–10.1)
Co2: 24 mmol/L (ref 21–32)
Creatinine: 3.52 mg/dL — ABNORMAL HIGH (ref 0.60–1.30)
EGFR (African American): 13 — ABNORMAL LOW
EGFR (Non-African Amer.): 11 — ABNORMAL LOW
GLUCOSE: 121 mg/dL — AB (ref 65–99)
OSMOLALITY: 260 (ref 275–301)
POTASSIUM: 3.2 mmol/L — AB (ref 3.5–5.1)
SODIUM: 121 mmol/L — AB (ref 136–145)

## 2014-02-09 LAB — HEPARIN LEVEL (UNFRACTIONATED)
Anti-Xa(Unfractionated): 0.55 IU/mL (ref 0.30–0.70)
Anti-Xa(Unfractionated): 0.59 IU/mL (ref 0.30–0.70)

## 2014-02-09 LAB — MAGNESIUM: Magnesium: 2.1 mg/dL

## 2014-02-10 DIAGNOSIS — I4891 Unspecified atrial fibrillation: Secondary | ICD-10-CM

## 2014-02-10 LAB — CBC WITH DIFFERENTIAL/PLATELET
BASOS ABS: 0 10*3/uL (ref 0.0–0.1)
Basophil %: 0.3 %
EOS ABS: 0.1 10*3/uL (ref 0.0–0.7)
Eosinophil %: 0.5 %
HCT: 28.4 % — AB (ref 35.0–47.0)
HGB: 9.1 g/dL — ABNORMAL LOW (ref 12.0–16.0)
Lymphocyte #: 0.8 10*3/uL — ABNORMAL LOW (ref 1.0–3.6)
Lymphocyte %: 4.5 %
MCH: 28.1 pg (ref 26.0–34.0)
MCHC: 32.1 g/dL (ref 32.0–36.0)
MCV: 88 fL (ref 80–100)
Monocyte #: 2 x10 3/mm — ABNORMAL HIGH (ref 0.2–0.9)
Monocyte %: 11.6 %
Neutrophil #: 14.6 10*3/uL — ABNORMAL HIGH (ref 1.4–6.5)
Neutrophil %: 83.1 %
PLATELETS: 243 10*3/uL (ref 150–440)
RBC: 3.24 10*6/uL — ABNORMAL LOW (ref 3.80–5.20)
RDW: 15.5 % — AB (ref 11.5–14.5)
WBC: 17.5 10*3/uL — ABNORMAL HIGH (ref 3.6–11.0)

## 2014-02-10 LAB — CULTURE, BLOOD (SINGLE)

## 2014-02-10 LAB — COMPREHENSIVE METABOLIC PANEL
ALT: 65 U/L — AB
Albumin: 1.9 g/dL — ABNORMAL LOW (ref 3.4–5.0)
Alkaline Phosphatase: 64 U/L
Anion Gap: 16 (ref 7–16)
BUN: 56 mg/dL — AB (ref 7–18)
Bilirubin,Total: 1 mg/dL (ref 0.2–1.0)
Calcium, Total: 7.3 mg/dL — ABNORMAL LOW (ref 8.5–10.1)
Chloride: 83 mmol/L — ABNORMAL LOW (ref 98–107)
Co2: 24 mmol/L (ref 21–32)
Creatinine: 3.36 mg/dL — ABNORMAL HIGH (ref 0.60–1.30)
GFR CALC AF AMER: 14 — AB
GFR CALC NON AF AMER: 12 — AB
Glucose: 114 mg/dL — ABNORMAL HIGH (ref 65–99)
Osmolality: 264 (ref 275–301)
Potassium: 2.9 mmol/L — ABNORMAL LOW (ref 3.5–5.1)
SGOT(AST): 33 U/L (ref 15–37)
SODIUM: 123 mmol/L — AB (ref 136–145)
Total Protein: 4.9 g/dL — ABNORMAL LOW (ref 6.4–8.2)

## 2014-02-10 LAB — HEPARIN LEVEL (UNFRACTIONATED): Anti-Xa(Unfractionated): 0.77 IU/mL — ABNORMAL HIGH (ref 0.30–0.70)

## 2014-02-10 LAB — EXPECTORATED SPUTUM ASSESSMENT W REFEX TO RESP CULTURE

## 2014-02-20 ENCOUNTER — Ambulatory Visit: Payer: Self-pay | Admitting: Internal Medicine

## 2014-02-20 ENCOUNTER — Ambulatory Visit: Payer: Self-pay | Admitting: Oncology

## 2014-02-20 DEATH — deceased

## 2014-10-12 NOTE — Discharge Summary (Signed)
PATIENT NAME:  Victoria Rodgers, Victoria Rodgers MR#:  [id number] DATE OF BIRTH:  1927/09/14  DATE OF ADMISSION:  06/15/2013 DATE OF DISCHARGE:  06/16/2013  DISCHARGE DIAGNOSES:   1.  Intractable nausea, vomiting, diarrhea, likely due to the flu with possible viral gastrointestinal illness.   2.  History of colitis, could be also giving her diarrhea, now improving. Will require outpatient gastrointestinal evaluation, which the patient is in agreement for.  3.  Influenza A positive, on Tamiflu.   SECONDARY DIAGNOSES: 1.  Hypertension.  2.  Gout.  3.  Hypothyroidism.  4.  Gastroesophageal reflux disease.   CONSULTATIONS: None.   PROCEDURES AND RADIOLOGY: Chest x-ray on the 25th of December showed no acute cardiopulmonary disease.   HISTORY AND SHORT HOSPITAL COURSE: The patient is an 81 year old female with the above-mentioned medical problems who was admitted for intractable nausea, vomiting and diarrhea with positive influenza A test. She was started on Tamiflu. Her symptoms have resolved under observation. She did not have any nausea or vomiting while in the hospital. She did have some diarrhea which was improved. She felt her diarrhea could be secondary to her underlying history of colitis and she was tolerating diet. With her husband being sick and likely being admitted to hospital, she was very desperate to go back home as she wanted to help him out and felt that she is already better but needs to support her family and was discharged home at this time as per her consistent request on the date of discharge.  PERTINENT PHYSICAL EXAMINATION ON THE DATE OF DISCHARGE:  VITAL SIGNS:  Temperature 98.5, heart rate 65 per minute, respirations 20 per minute, blood pressure 152/80 mmHg and she was saturating 97% on room air.  CARDIOVASCULAR: S1, S2 normal. No murmurs, rubs or gallop.  LUNGS: Clear to auscultation bilaterally. No wheezing, rales, rhonchi or crepitation.  ABDOMEN: Soft, benign.  NEUROLOGIC:  Nonfocal examination.  All other physical examination remained at baseline.   DISCHARGE MEDICATIONS: 1.  Escitalopram 10 mg p.o. daily.  2.  Levothyroxine 88 mcg p.o. daily.  3.  Premarin 0.3 mg p.o. daily.  4.  Allopurinol 100 mg p.o. daily.  5.  Aspirin 325 mg p.o. daily.  6.  Nexium 40 mg p.o. daily.  7.  Benicar 40 mg 1/2 tablet p.o. daily.  8.  HCTZ/triamterene 25/37.5 mg 1/4 tablet p.o. daily.  9.  Albuterol 2 puffs inhaled every 4 hours as needed.  10.  Tamiflu 75 mg p.o. b.i.d. for 4 more days.   DISCHARGE DIET: Low sodium, low fat, low cholesterol.   DISCHARGE ACTIVITY: As tolerated.   DISCHARGE INSTRUCTIONS AND FOLLOWUP: The patient was instructed to follow up with her primary care physician, Dr. Tera MaterJim Hedrick, in 1 to 2 weeks. She will need followup with her GI physician in 2 to 4 weeks.   TOTAL TIME DISCHARGING THIS PATIENT: 45 minutes.   ____________________________ Ellamae SiaVipul S. Sherryll BurgerShah, MD vss:cs D: 06/18/2013 11:02:29 ET T: 06/18/2013 20:35:17 ET JOB#: [id number]  cc: Alura Olveda S. Sherryll BurgerShah, MD, <Dictator> Rhona LeavensJames F. Burnett ShengHedrick, MD [facility] Ellamae SiaVIPUL S [facility] MD ELECTRONICALLY SIGNED 06/19/2013 6:51

## 2014-10-12 NOTE — Discharge Summary (Signed)
PATIENT NAME:  Victoria Rodgers, Victoria Rodgers MR#:  [id number] DATE OF BIRTH:  04-19-1928  DATE OF ADMISSION:  03/30/2013 DATE OF DISCHARGE:  03/31/2013  ADMISSION DIAGNOSIS: Chest pain.   DISCHARGE DIAGNOSES: 1.  Chest pain.  2.  Recent knee surgery.  3.  Chronic kidney disease.  4.  Hypertension.  6.  Chronic anemia.  7.  Hypothyroidism.   CONSULTATIONS: None.   IMAGING: The patient underwent a V/Q scan which showed normal-appearing V/Q scan.   LABORATORY DATA: Discharge white blood cells 5.8, hemoglobin 9, hematocrit 27, platelets 270. Sodium 134, potassium 4.5, chloride 105, bicarbonate 23, BUN 16, creatinine 1.61, glucose 93. Troponin x 3 were negative.   HOSPITAL COURSE: This is a 81 year old female with recent knee surgery, who presented with chest pain and found to have elevated D-dimer. For further details, please refer to Dr.  Suzanne BoronKonidena's H and P.   1.  Chest pain. The patient was admitted to telemetry. She was ruled out with cardiac enzymes. She underwent a V/Q scan given her elevated D-dimer and recent knee surgery. Her V/Q scan was negative. I did speak with Dr. Kirke CorinArida as the patient probably should get an outpatient stress test and he will arrange for this on Monday as the patient cannot have a stress test until 48 hours after the V/Q scan. She had no ischemic changes on EKG or telemetry.  2.  Chronic kidney disease, which is stable.  3.  Hypertension, controlled on Maxzide, Lopressor and Benicar.  4.  Chronic anemia. Hemoglobin remained stable.  5.  Recent right knee surgery.  6.  Hypothyroidism. The patient will continue on Synthroid.   MEDICATIONS:  1.  Lexapro 10 mg daily.  2.  HCTZ triamterene 25/37.5, one half tablet daily.  3.  Synthroid 88 mcg daily.  4.  Benicar 40 mg, 0.25 daily.  5.  Premarin 0.3 mg 1 tablet daily.  6.  Allopurinol 100 mg daily.  7.  Aspirin 325 mg daily.  8.  Nexium 40 mg daily.   DISCHARGE DIET: Low sodium.   DISCHARGE ACTIVITY: As tolerated.    DISCHARGE FOLLOWUP:  The patient will follow up on Monday with Dr. Kirke CorinArida. The patient will follow up 1 week with Dr. Burnett ShengHedrick. [facility] will call the patient to set up an appointment for Monday or Tuesday for a stress test.    TIME SPENT: Approximately 35 minutes.   The patient was medically stable for discharge.    ____________________________ Brenn Gatton P. Juliene PinaMody, MD spm:dp D: 03/31/2013 13:50:33 ET T: 03/31/2013 15:04:22 ET JOB#: [id number]  cc: Belinda Schlichting P. Juliene PinaMody, MD, <Dictator> Muhammad A. Kirke CorinArida, MD Rhona LeavensJames F. Burnett ShengHedrick, MD Janyth ContesSITAL P Timia Casselman MD ELECTRONICALLY SIGNED 04/01/2013 10:42

## 2014-10-12 NOTE — H&P (Signed)
PATIENT NAME:  Victoria Rodgers, Eyvonne MR#:  [id number] DATE OF BIRTH:  [birth year]  DATE OF ADMISSION:  03/30/2013  PRIMARY CARE PHYSICIAN: Jerl MinaJames Hedrick, MD  ER PHYSICIAN: Dr. Mindi JunkerGottlieb   CHIEF COMPLAINT: Chest pain.   HISTORY OF PRESENT ILLNESS: The patient is an 81 year old female patient with a right knee replacement 2-1/2 weeks ago, came in because of chest pain. The patient noticed chest pain, left sided, and also middle of the chest going on for about 1 week, and because of persistent pain the patient came in here. The patient thought she had like acid reflux, then symptoms did not go away for like a week, so she thought she needed to come. In the ER, the patient's blood work is essentially normal, but V/Q scan could not be done as they did not have any more dye to inject, so the patient needs to have a V/Q scan due to her recent surgery and possibility of the PE. She cannot get a CT chest because of the chronic kidney disease. The patient does not have any trouble breathing. No cough. No fever. The chest pain is in the middle of the chest. No radiation. No aggravating factors or relieving factors. The patient did take Lovenox injections for 2 weeks, and the past 3 to 4 days she started on aspirin 325 mg daily.  She was at the rehab at [facility] for 2 weeks and then discharged home 3 to 4 days ago, and she is getting the rehab at outpatient rehab. According to her, she is progressing well, and she is just on aspirin for that, DVT prophylaxis.    PAST MEDICAL HISTORY: Significant for: 1.  History of hypertension.  2.  Chronic kidney disease, stage 3.  3.  Chronic anemia.  4.  Hyperlipidemia.   ALLERGIES: PENICILLIN, SULFA, PAIN RELIEVERS.  HOME MEDICATIONS:  1.  Levothyroxine 88 mcg p.o. daily.  2.  Benicar 40 mg 0.25 tablet daily. That is 10 mg of Benicar.  3.  Celexa 10 mg daily.  4.  HCTZ/triamterene 25/37.5 mg p.o. daily.  5.  Nexium 40 mg daily.  6.  Premarin 0.3 mg p.o. once a day.  7.   Allopurinol 100 mg p.o. daily.   PAST SURGICAL HISTORY: Significant for:  She has had multiple surgeries, including right total knee replacement, 3 back surgeries  ruptured disk operation and then 3 knee operations, hip replacement, neck surgery.   FAMILY HISTORY: No hypertension, no diabetes.   SOCIAL HISTORY: No smoking. No drinking. Lives at home at this time.   REVIEW OF SYSTEMS: CONSTITUTIONAL: No fever. CARDIOVASCULAR:  No chest pain.  GASTROINTESTINAL: No nausea. No vomiting.  ENDOCRINE: No diabetes.  MUSCULOSKELETAL: Has right knee replacement, progressing well with physical therapy. EYES: No conjunctivitis.  ENT: The patient has no ear pain. No epistaxis. No difficulty swallowing.  CARDIOVASCULAR: Complains of chest pain. PULMONARY: No trouble breathing.  GASTROINTESTINAL: No abdominal pain. No nausea. No vomiting.   PHYSICAL EXAMINATION:  VITAL SIGNS: Temperature is 98, heart rate 77, blood pressure 131/62, sats 97% on room air.  GENERAL: Alert, awake, oriented. Not in distress. Answers questions appropriately.  HEAD: Atraumatic, normocephalic. Pupils equal, reacting to light.  ENT: No tympanic membrane congestion, no turbinate hypertrophy. No oropharyngeal edema. NECK: Normal range of motion. No JVD. No carotid bruit.  CARDIOVASCULAR: S1, S2 regular. No murmurs.  LUNGS: Clear to auscultation. No wheeze. No rales.  ABDOMEN: Soft, nontender, nondistended. Bowel sounds present. The patient has bowel sounds within all quadrants.  No rebound tenderness. No guarding. No hepatosplenomegaly.  MUSCULOSKELETAL: The patient has a right knee postoperative scar present but no swelling, and range of motion is normal in right and left legs.  SKIN: Warm and dry. No cyanosis. No skin rashes.  NEUROLOGICAL: Alert, awake, oriented. Cranial nerves II through XII intact. Power 5 out of 5 in upper and lower extremities. Sensation is intact. Deep tendon reflexes 2+ bilaterally PSYCHIATRIC: Mood  and affect within normal limits.   LABORATORY DATA: Sodium 134, potassium 4.5, chloride 104, bicarbonate 23, BUN 20, creatinine 1.5. Glucose 95. White count 6.8, hemoglobin 9.6, hematocrit 28.5, platelets 319.  EKG: Normal sinus rhythm. No ST-T changes.   ASSESSMENT AND PLAN: This is an 81 year old female patient with hypertension, chronic kidney disease, stage 3, history of gout with recent right total knee replacement at [facility], in because of: 1.  Chest pain most likely may be related to GERD, but we have to rule out pulmonary emboli due to recent surgery. The patient was on anticoagulation with Lovenox for 2 weeks, then changed to aspirin 325 daily. The patient is supposed to get a V/Q scan tomorrow morning. If it is negative, she can go home. Meanwhile, continue the patient on aspirin and also cycle the troponins and also use nitroglycerin as needed for chest pain. Continue PPIs for GERD symptoms.  2.  Chronic kidney disease, stage 3, stable.  3.  History of anemia secondary to chronic kidney disease. She gets Procrit injections. Last Procrit injection was 2 days ago. Follows up with Dr. Orlie Dakin, and hemoglobin is up at 9.3, and usually her baseline is around 9.  4.  History of gout, stable. She is on allopurinol. Continue that.  5.  Hypertension. Continue home medications.  6.  Hypothyroidism. Continue Synthroid.  7.  Depression. Continue Celexa. 8.  Deep vein thrombosis prophylaxis with Lovenox.  9.  Get physical therapy to continue to work on right knee rehabilitation.   TIME SPENT: More than 50 minutes.  ____________________________ Katha Hamming, MD sk:cb D: 03/30/2013 17:41:46 ET T: 03/30/2013 20:19:59 ET JOB#: [id number] cc: Katha Hamming, MD, <Dictator> Rhona Leavens. Burnett Sheng, MD Katha Hamming MD ELECTRONICALLY SIGNED 04/25/2013 13:32

## 2014-10-13 NOTE — Consult Note (Signed)
Brief Consult Note: Diagnosis: multisystem organ failure, likely acute calculus cholecystits. MI,sepsis.   Patient was seen by consultant.   Consult note dictated.   Discussed with Attending MD.   Comments: if family desires anything in way of further RX would rec perc chole tube.    NOT a SURGICAL CANDIDATE.  Electronic Signatures: Natale LayBird, Verenis Nicosia (MD)  (Signed 18-Aug-15 12:44)  Authored: Brief Consult Note   Last Updated: 18-Aug-15 12:44 by Natale LayBird, Ranjit Ashurst (MD)

## 2014-10-13 NOTE — Consult Note (Signed)
Comments   I met with pt's son. Updated him on pt's current condition. Son has been able to talk openly with pt's husband and husband accepts pt's poor prognosis. They are awaiting arrival of pt's daughters from [place] and [place] today and I will meet with them when all family are present.  discussed dialysis with son and family do not want to proceed with this.  expressed appreciation for meeting and for the care pt has been given.   Electronic Signatures: Phifer, Nancy (MD)  (Signed 19-Aug-15 10:04)  Authored: Palliative Care   Last Updated: 19-Aug-15 10:04 by Phifer, Nancy (MD) 

## 2014-10-13 NOTE — Consult Note (Signed)
   Comments   RN reports that pt's husband became unresponsive & pulseless at pt's bedside. CPR performed, husband responded & sent to ER.  spoke with pt's son who is now with his father in ER. Son is obviously emotionally distraught over the acute illnesses of both of his parents. Pt's daughter is coming from [place] but won't be here until tomorrow. Obviously, I am unable to speak with pt's husband at this time so son is Insurance underwriterdefault decision-maker. We discussed pt's code status. Son does not want pt resuscitated in the event of another cardiac arrest. DNR order entered.  now, son would like to continue pt's current medical tx though he understands that she is critically ill and may not survive this illness. I will continue to meet with him and with his sister when she arrives. Support offered. Chaplain has spoken with son.   Electronic Signatures: Lendell Gallick, Harriett SineNancy (MD)  (Signed 18-Aug-15 13:07)  Authored: Palliative Care   Last Updated: 18-Aug-15 13:07 by Jeremih Dearmas, Harriett SineNancy (MD)

## 2014-10-13 NOTE — Consult Note (Signed)
PATIENT NAME:  Victoria BowenWHITCOMB, Loyalty J MR#:  [id number] DATE OF BIRTH:  09-13-1927  DATE OF CONSULTATION:  02/06/2014  CONSULTING PHYSICIAN:  Loraine LericheMark A. Egbert GaribaldiBird, MD  REASON FOR CONSULTATION:  Consideration of percutaneous cholecystostomy tube.   HISTORY OF PRESENT ILLNESS:  An 81 year old white female admitted to the medical service on August 15, with nausea, vomiting, diarrhea, chest pain. She was found to have an active myocardial infarction. Due to her evolving sepsis, the patient did not have a cardiac catheterization. Last night the patient had a junctional rhythm requiring cardiac intervention, as well as respiratory failure, hypotension, lactic acidosis and has since become on the ventilator with vasopressor requirements. Imaging demonstrates gallbladder that is surrounded by pericholecystic fluid confirmed by an ultrasound with gallbladder wall thickening and stones. The patient also had PEA on the August 17. She has had diagnosis of C. difficile colitis. Surgical services were asked to comment regarding the feasibility of cholecystostomy tube.   ALLERGIES, MEDICATIONS, PAST MEDICAL AND SURGICAL HISTORY:  Well defined in the chart.   PHYSICAL EXAMINATION: GENERAL: The patient is on a ventilator. She is both on a dopamine and norepinephrine drip. She is on propofol.  ABDOMEN:  Her abdomen is soft and apparently nontender. Examination is limited due to her sedation. There are no obvious masses or hernias. Multiple support tubes and lines are present.   LABORATORY DATA: Sodium is 124, potassium 3.5, creatinine is 2.8, BUN 29, glucose 130.  Liver function tests total bilirubin is 2.8, AST is 103, ALT is 65, alkaline phosphatase is 64. White count is 9.3.  Admission white count was 10, peaked at 18 on August 17, hemoglobin is 9.9, platelet count 249,000.  View of CT scan is as described above.   IMPRESSION: This patient is complex on a ventilator now do not resuscitate by the patient's family request.  Of  note, her husband had a myocardial infarction with loss of pulse while in room earlier today and is now in the Emergency Room. Available family consisted of a son, which made the patient DNR. She is currently on a heparin drip, as well as significantly septic, as well as recent myocardial infarction. She is not a surgical candidate at this time. If the  patient's family desires further treatment and intervention, percutaneous cholecystostomy in a  heparin window would be the next reasonable step. At the present, continue supportive measures, intravenous antibiotics, and cardiovascular.   I have no other recommendations at this time.    ____________________________ Redge GainerMark A. Egbert GaribaldiBird, MD mab:DT D: 02/06/2014 16:53:15 ET T: 02/06/2014 17:42:32 ET JOB#: [id number]  cc: Loraine LericheMark A. Egbert GaribaldiBird, MD, <Dictator> Raynald KempMARK A Sherlon Nied MD ELECTRONICALLY SIGNED 02/11/2014 18:26

## 2014-10-13 NOTE — Consult Note (Signed)
PATIENT NAME:  Victoria Rodgers, Victoria Rodgers MR#:  [id number] DATE OF BIRTH:  1928-05-05  DATE OF CONSULTATION:  02/06/2014  REFERRING PHYSICIAN: Dr. Darvin Neighbours CONSULTING PHYSICIAN:  Nain Rudd Lilian Kapur, MD  REASON FOR CONSULTATION: Acute renal failure and metabolic acidosis.   HISTORY OF PRESENT ILLNESS: The patient is an 81 year old Caucasian female with a past medical history of hypertension, gout, hypothyroidism, GERD, history of chronic colitis, who presented to [facility] on 02/03/2014 with nausea, vomiting, diarrhea, and chest pressure. Initial workup revealed that she had a non-ST elevation myocardial infarction. She was also having diarrhea and has been found to have C. difficile colitis. We are asked to see her for evaluation and management of acute renal failure. Upon presentation, the patient's creatinine was found to be 1.5. Her baseline creatinine appears to be 1.21 with an eGFR of 41. Therefore, it appears that she does have underlying chronic kidney disease stage III. The patient's creatinine has worsened over the hospitalization and is currently 2.81. Yesterday, there were sustained periods of hypotension. The patient had cardiopulmonary arrest and was resuscitated and brought to the critical care unit. She is currently intubated and is on 100% FiO2 while on the ventilator. She made 450 mL of urine yesterday. The patient's son is currently at the bedside. They have also met with palliative care, and the goals of care are yet to be determined. The patient's husband will be here soon.   PAST MEDICAL HISTORY:  1.  Hypertension.  2.  Gout.  3.  Hypothyroidism.  4.  GERD.  5.  History of chronic colitis.   ALLERGIES: OXYCONTIN AND SULFA.  CURRENT INPATIENT MEDICATIONS INCLUDE:  1.  Heparin drip.  2.  Sodium bicarbonate drip 150 mEq at 65 mL/hour. 3.  Propofol drip.  4.  Amiodarone drip.  5.  Norepinephrine drip.  6.  Acetaminophen 650 mg p.o. q. 4 hours p.r.n.  7.   Allopurinol 100 mg p.o. daily.  8.  Lexapro 10 mg p.o. daily.  9.  Synthroid 88 mcg p.o. daily.  10.  Zofran 4 mg IV q. 4 hours p.r.n.  11.  Tramadol 50 mg p.o. q. 6 hours p.r.n.  12.  Trazodone 50 mg p.o. at bedtime.  13.  Metoprolol 25 mg p.o. q. 6 hours.  14.  Zocor 10 mg p.o. at bedtime.  15.  Metoprolol 2.5 mg IV q. 6 hours p.r.n. heart rate greater than 125.  16.  Aspirin 81 mg daily.  17.  Famotidine 20 mg IV daily.  18.  Hydrocortisone 50 mg IV q. 6 hours.  19.  Meropenem 1 gram IV x 1.  20.  Metronidazole 500 mg IV q .8 hours.  21.  Vancomycin 250 mg p.o. q. 6 hours.   SOCIAL HISTORY: Unable to obtain from the patient. However, per dictated history and physical, it appears that the patient has a history of tobacco abuse in the past but quit 40 years ago. No alcohol use. Lives in independent living at [facility] with her husband prior to hospitalization.   FAMILY HISTORY: Unable to obtain directly from the patient. However, per dictated history and physical, it appears that the patient's father died from colon cancer, and the patient's mother had suspected ovarian cancer.  REVIEW OF SYSTEMS:  Unable to obtain from the patient as she is currently intubated.   PHYSICAL EXAMINATION:  VITAL SIGNS: Temperature 97, pulse 132, respirations 20, blood pressure 88/59, pulse oximetry 97% on 100% FiO2 on the ventilator.  GENERAL: Physical  examination reveals a critically ill-appearing female.  HEENT: Normocephalic, atraumatic. No spontaneous extraocular movements were noted. Pupils were 5 mm and reactive bilaterally. Endotracheal tube is noted to be in place. NECK:  Supple with no JVD or lymphadenopathy.  LUNGS: Demonstrated rhonchi bilaterally, and breath sounds are currently ventilator assisted.  CARDIOVASCULAR: S1, S2 noted to be irregular and tachycardic. No rubs appreciated.  ABDOMEN: Soft, nontender, nondistended. Bowel sounds positive. No rebound or guarding. No gross organomegaly  appreciated.  EXTREMITIES: No clubbing or cyanosis noted. Has 1+ edema noted in bilateral lower extremities.  SKIN: Warm and dry. No rashes noted.  MUSCULOSKELETAL: No joint redness, swelling, or tenderness appreciated.  GENITOURINARY: Foley catheter noted to be in place.  PSYCHIATRIC: Unable to assess at this time.   LABORATORY DATA: Sodium 124, potassium 3.5, chloride 88, CO2 of 20, BUN 29, creatinine 2.81, glucose 120, total protein 5.8, albumin 2.8, total bilirubin 2.8, alkaline phosphatase 64, AST 103, ALT 65. CBC shows WBC of 9.3, hemoglobin 9.9, hematocrit 31, platelets 249,000. Blood cultures x 2 sets are negative.  C. difficile toxin is positive. ABG shows pH of 7.22, pCO2 of 36, pO2 of 57, FiO2 of 100%.  DIAGNOSTIC DATA: A 2D echocardiogram shows EF of 20% to 25%, severely decreased global left ventricular systolic function.  There is diffuse hypokinesis. Chest x-ray shows a new right effusion bibasilar consolidation.   IMPRESSION: This is an 81 year old Caucasian female with a past medical history of hypertension, gout, hypothyroidism, GERD, depression, chronic colitis, who presented to [facility] with nausea, vomiting, diarrhea, and chest pressure and found to have a non-ST elevation MI as well as significant heart failure with an EF of 20% to 25%. 1.  Acute renal failure. Suspect that she most likely has acute tubular necrosis. Abnormal cardiorenal hemodynamics may also be playing a role, as ejection fraction is quite low at 20% to 25%. I had a long discussion with the patient's son at the bedside. Urine output was only 450 mL yesterday. There is some potential for renal function to continue to worsen. Family is actively discussing goals of care at present; however, they have not yet reached a definitive decision. If the family desires aggressive care, we could potentially consider renal replacement therapy; however, it is unclear as to whether this would change the  ultimate outcome at this time. For now, supportive care is to be continued, and we will check back with the care team as well as the family as to what they would want in terms of ongoing care.  3.  Hyponatremia. Suspect that heart failure is playing some role with this. Serum sodium is slightly higher than it was yesterday at 124. Continue sodium bicarbonate drip for now, as this does contain sodium.  4.  Metabolic acidosis. The patient appears to have lactic acidosis. This could be from sustained hypotension yesterday. Lactic acid was as high as 10.8. Continue sodium bicarbonate drip for now as above.  5.  Acute systolic heart failure exacerbation. EF is quite low at 20% to 25%. Off diuretic therapy at present. Cardiology also following.  6.  Acute respiratory failure. The patient is currently requiring 100% FiO2 on the ventilator. Pulmonary critical care is following. Ventilator support to be continued for now. However, family considering overall goals of care at present.  7.  Overall prognosis quite guarded at this point in time. Awaiting further input from family as well as palliative care.    ____________________________ Tama High, MD mnl:DT D: 02/06/2014  09:48:49 ET T: 02/06/2014 10:28:00 ET JOB#: [id number]  cc: Tama High, MD, <Dictator> Mariah Milling Takeo Harts MD ELECTRONICALLY SIGNED 03/08/2014 11:32

## 2014-10-13 NOTE — Discharge Summary (Signed)
PATIENT NAME:  Victoria BowenWHITCOMB, Lakita J MR#:  [id number] DATE OF BIRTH:  [birth year]  DATE OF ADMISSION:  02/03/2014  DATE OF DISCHARGE:  02/11/2014  DISCHARGE DIAGNOSES:  1.  Non-ST segment elevation myocardial infarction  2.  Systolic congestive heart failure with cardiomyopathy with ejection fraction of 20%.  3.  Paroxysmal atrial fibrillation with rapid ventricular rate.  4.  Acute renal failure.  5.  Acute respiratory failure.  6.  Hyponatremia.  7.  Acute cholecystitis.  8.  Acute Clostridium difficile colitis.  9.  Aspiration pneumonia.  10. Anemia of chronic disease.  11. Malnutrition.  12. Hypoalbuminemia.  13. Hypokalemia.   IMAGING STUDIES: Include:  1.  Echocardiogram which showed ejection fraction of 20%.  2.  CT scan of the abdomen and pelvis without contrast showed findings concerning for acute cholecystitis, bilateral pleural effusions.  3.  Chest x-ray showed pulmonary edema, possible bilateral infiltrates.  4.  Ultrasound of the right upper quadrant showed cholecystitis without any gallstones.   CONSULTS:  1.  Ned GraceNancy Phifer, MD with palliative care.  2.  Eldridge DaceMuhammed Arida, MD and Julien Nordmannimothy Gollan, MD with cardiology.  3.  Munsoor Cherylann RatelLateef, MD and Mosetta PigeonHarmeet Singh with nephrology.  4.  Clydie Braunavid Fitzgerald, MD with infectious disease. 5.  Erin FullingKurian Kasa, MD with pulmonary.  6.  Natale LayMark Bird, MD with surgery.   ADMITTING HISTORY AND PHYSICAL:   Please see detailed H and P dictated earlier by Dr. Allena KatzPatel.  In brief, an 81 year old Caucasian female patient presented to the hospital complaining of nausea, vomiting, diarrhea, and chest pain. The patient was admitted to have NSTEMI and admitted to hospitalist service.   HOSPITAL COURSE:  1.  NSTEMI with acute on chronic systolic congestive heart failure. The patient was found to have elevated troponin with EF decreased to 20%. The patient was supposed to have a cath but with worsening creatinine, cardiac catheterization could not be done.  In the  meantime, patient intubated and had multiple acute issues and could not get a catheter but was treated aggressively with medical management, with aspirin, statin, heparin drip, beta blockers.  2.  Shock, both septic and cardiogenic. The patient was on multiple pressors and finally could be weaned off the pressor support prior to discharge.  3.  Acute renal failure. The patient had worsening acute renal failure secondary to her shock. This does seem to have stabilized her creatinine at 3.45.  4.  The patient was treated with antibiotics for acute Clostridium difficile for acute cholecystitis and aspiration pneumonia. She was not a candidate for surgery for the cholecystitis considering the multiple comorbidities.   The patient was extubated finally and was transitioned to nasal cannula, but with worsening status, the patient was made DNR/DNI.  The family requested that patient be discharged back home with hospice on comfort measures. The patient has been given prescriptions for her pain medications and anxiety and hospice set-up for her end-of-life care at home and discharge with family.   DISCHARGE MEDICATIONS:  1.  Morphine 20 mg/mL, 0.5 mL every hour as needed for pain or shortness of breath.  2.  Lorazepam 2 mg per mL, 1 mL every hour as needed for anxiety.  3.  Acetaminophen 325 mg 2 tablets every 4 hours as needed for pain or fever.   DISCHARGE INSTRUCTIONS: Regular food, regular activity. Follow up with hospice.  TIME SPENT ON DAY OF DISCHARGE IN DISCHARGE ACTIVITY:  40 minutes/     ____________________________ Molinda BailiffSrikar R. Gelila Well, MD srs:DT D: 02/15/2014 13:22:49  ET T: 02/15/2014 14:08:21 ET JOB#: [id number]  cc: Wardell Heath R. Krislyn Donnan, MD, <Dictator> Orie Fisherman MD ELECTRONICALLY SIGNED 03/06/2014 16:30

## 2014-10-13 NOTE — Consult Note (Signed)
Chief Complaint:  Subjective/Chief Complaint Patient is awake and alert this morning.  Extubated yesterday.  Hoarse.  Denies chest pain, a little congested but overall breathing ok.  Remains in NSR.  Off norepinephrine.   VITAL SIGNS/ANCILLARY NOTES: **Vital Signs.:   22-Aug-15 09:00  Vital Signs Type Routine  Temperature Temperature (F) 98.4  Celsius 36.8  Temperature Source oral  Pulse Pulse 80  Respirations Respirations 20  Systolic BP Systolic BP 456  Diastolic BP (mmHg) Diastolic BP (mmHg) 59  Mean BP 85  BP Source  if not from Vital Sign Device non-invasive  Pulse Ox % Pulse Ox % 91  Pulse Ox Activity Level  At rest  Oxygen Delivery Room Air/ 21 %  *Intake and Output.:   Daily 22-Aug-15 07:00  Grand Totals Intake:  1207 Output:  1250    Net:  -74 24 Hr.:  -43  Heparin      In:  286  IV (Primary)      In:  40  IV (Primary)      In:  4.2  IV (Primary)      In:  60  IV (Primary)      In:  282.2  IV (Primary)      In:  234.6  IV (Secondary)      In:  300  Urine ml     Out:  1250  Length of Stay Totals Intake:  12033.4 Output:  6425    Net:  5608.4   Brief Assessment:  GEN no acute distress   Cardiac Regular  no murmur  -- LE edema  --Gallop  JVP 8 cm.   Respiratory normal resp effort  Decreased breath sounds at bases bilaterally.   Gastrointestinal details normal Soft  Nontender  Nondistended   EXTR negative cyanosis/clubbing   Lab Results: Hepatic:  22-Aug-15 04:47   Bilirubin, Total 1.0  Alkaline Phosphatase 64 (46-116 NOTE: New Reference Range 01/09/14)  SGPT (ALT)  65 (14-63 NOTE: New Reference Range 01/09/14)  SGOT (AST) 33  Total Protein, Serum  4.9  Albumin, Serum  1.9  Routine Chem:  22-Aug-15 04:47   Glucose, Serum  114  BUN  56  Creatinine (comp)  3.36  Sodium, Serum  123  Potassium, Serum  2.9  Chloride, Serum  83  CO2, Serum 24  Calcium (Total), Serum  7.3  Osmolality (calc) 264  eGFR (African American)  14  eGFR (Non-African  American)  12 (eGFR values <58m/min/1.73 m2 may be an indication of chronic kidney disease (CKD). Calculated eGFR is useful in patients with stable renal function. The eGFR calculation will not be reliable in acutely ill patients when serum creatinine is changing rapidly. It is not useful in  patients on dialysis. The eGFR calculation may not be applicable to patients at the low and high extremes of body sizes, pregnant women, and vegetarians.)  Anion Gap 16  Result Comment LABS - This specimen was collected through an   - indwelling catheter or arterial line.  - A minimum of 563m of blood was wasted prior    - to collecting the sample.  Interpret  - results with caution.  Result(s) reported on 10 Feb 2014 at 05:08AM.  Routine Hem:  22-Aug-15 04:47   WBC (CBC)  17.5  RBC (CBC)  3.24  Hemoglobin (CBC)  9.1  Hematocrit (CBC)  28.4  Platelet Count (CBC) 243  MCV 88  MCH 28.1  MCHC 32.1  RDW  15.5  Neutrophil % 83.1  Lymphocyte %  4.5  Monocyte % 11.6  Eosinophil % 0.5  Basophil % 0.3  Neutrophil #  14.6  Lymphocyte #  0.8  Monocyte #  2.0  Eosinophil # 0.1  Basophil # 0.0   Assessment/Plan:  Assessment/Plan:  Assessment 81 yo admitted with rapid atrial flutter and prerenal azotemia due to C-Diff Colitis after antibiotics for URI.  She developed hypoxic resp failure with brady-PEA cardiac arrest.  Developed AKI due to ATN.  Found to have cardiomyopathy, EF 20-25%.  1) Cardiac arrest: bradycardia and then junctional:  s/p CPR & Intubation.Remains intubated/sedated.  Was most likely related to hypoxic resp failure from PNA/C diff/septic shock and acute CHF. - Currently off of pressors and hemodynamically stable in sinus rhythm. - She did not tolerate dopamine or dobutamine -> both resulted in rapid afib/flutter. 2) Shock: Combined septic & cardiogenic - stable off of pressors. 3) Cardiomyopathy: EF 20%. Possible tachy-mediated versus stress (Takotsubo-type) CMP, probably less  likely ischemic. On exam, she is not markedly volume overloaded.  4) Atrial fibrillation/flutter:  Paroxysmal. currently in NSR.  Agree with transition to po amiodarone 200 mg bid and Burt Lovenox.  5) Elevated troponin: Suspect demand ischemia with atrial fib/flutter/RVR and shock.  Eventually will need ischemic workup.  Not cath candidate at this time with AKI. 6) AKI: ATN in setting of shock.  Creatinine seems to be slowly improving.  Adequate UOP.  7) ID: C diff colitis, being treated.  Possible HCAP.  Also possible acute cholecystitis (not surgical candidate.  She is being treated with abx by ID.   Electronic Signatures: McLean, Dalton S (MD)  (Signed 22-Aug-15 10:06)  Authored: Chief Complaint, VITAL SIGNS/ANCILLARY NOTES, Brief Assessment, Lab Results, Assessment/Plan   Last Updated: 22-Aug-15 10:06 by McLean, Dalton S (MD) 

## 2014-10-13 NOTE — H&P (Signed)
PATIENT NAME:  Victoria Rodgers, Victoria Rodgers MR#:  [id number] DATE OF BIRTH:  [birth year]  DATE OF ADMISSION:  06/15/2013  PRIMARY CARE PHYSICIAN:  Dr. Tera Mater.   CHIEF COMPLAINT:  Nausea, vomiting, and diarrhea.   HISTORY OF PRESENT ILLNESS:  This is an 81 year old female, who comes to the Emergency Room due to intractable nausea, vomiting, and diarrhea ongoing for the past 3 to 4 days. The patient was here in the Emergency Room about 2 days ago, diagnosed with the flu. She was started on Tamiflu. She was still having the nausea and vomiting at that time, but no diarrhea. She went back to her independent living at [facility], but has not improved at all, has not been able to keep anything down for the past 3 to 4 days. She therefore was a bit concerned and came to the ER for further evaluation. Hospitalist services were contacted for further treatment and evaluation. The patient denies any chest pain. She denies any abdominal pain. She denies any fevers, denies any weight gain or weight loss or any other associated symptoms presently.   REVIEW OF SYSTEMS:  CONSTITUTIONAL:  No documented fever. No weight gain or weight loss.  EYES:  No blurred or double vision.  ENT:  No tinnitus. No postnasal drip. No redness of the oropharynx.  RESPIRATORY:  Positive cough. No wheeze. No hemoptysis, no dyspnea.  CARDIOVASCULAR:  No chest pain. No orthopnea, no palpitations, no syncope.  GASTROINTESTINAL:  Positive nausea. Positive vomiting. Positive diarrhea. No abdominal pain. No melena or hematochezia.  GENITOURINARY:  No dysuria or hematuria.  ENDOCRINE:  No polyuria or nocturia. No heat or cold intolerance.  HEMATOLOGIC:  No anemia, no bruising, no bleeding.  INTEGUMENTARY:  No rashes or lesions.  MUSCULOSKELETAL:  No arthritis, no swelling, no gout.  NEUROLOGIC:  No numbness or tingling. No ataxia. No seizure-type activity.  PSYCHIATRIC:  No anxiety, no insomnia. No ADD.   PAST MEDICAL HISTORY:  Consistent with  hypertension, gout, hypothyroidism, GERD.   ALLERGIES:  OXYCONTIN and SULFA DRUGS; they BOTH CAUSE NAUSEA.   SOCIAL HISTORY:  Used to be a smoker, quit about 40+ years ago, does have a 20 pack-year smoking history. No alcohol abuse. No illicit drug abuse. Lives at independent living at [facility] with her husband.   FAMILY HISTORY:  Both mother and father are deceased. Father died from colon cancer. Mother died from suspected ovarian cancer.   CURRENT MEDICATIONS:  Tamiflu 75 mg b.i.d., albuterol inhaler 2 puffs q.4 hours as needed, allopurinol 100 mg daily, aspirin 325 mg daily, Benicar 20 mg daily, Lexapro 10 mg daily, HCTZ/triamterene 25/37.5 a quarter tab daily, Synthroid 88 mcg daily, Nexium 40 mg daily, Premarin 0.3 mg daily.   PHYSICAL EXAMINATION:  VITAL SIGNS:  Presently, temperature is 99.7, pulse 45, respirations 18, blood pressure 145/69, sat 75% on room air.  GENERAL:  She is a pleasant appearing female in no apparent distress.  HEENT:  She is atraumatic, normocephalic. Extraocular muscles are intact. Pupils are equal and reactive to light. Sclerae are anicteric. No conjunctival injection. No pharyngeal erythema.  NECK:  Supple. There is no jugular venous distention, no bruits, no lymphadenopathy or thyromegaly.  HEART:  Exam is regular rate and rhythm. No murmurs, no rubs, no clicks.  LUNGS:  Clear to auscultation bilaterally. No rales or rhonchi, no wheezes.  ABDOMEN:  Soft, flat, nontender, nondistended. Has good bowel sounds. No hepatosplenomegaly appreciated.  EXTREMITIES:  No evidence of any cyanosis, clubbing, or peripheral edema. Has +  2 pedal and radial pulses bilaterally.  NEUROLOGICAL:  The patient is alert, awake, and oriented x3 with no focal motor or sensory deficits appreciated bilaterally.  SKIN:  Exam is moist and warm with no rashes appreciated.  LYMPHATIC:  There is no cervical or axillary lymphadenopathy.   LABORATORY EXAMINATION:  Serum glucose of 98, BUN 13,  creatinine 1.2, sodium 137, potassium 3.4, chloride 107, bicarb 25. LFTs are within normal limits. Troponin less than 0.02. White cell count 5.2, hemoglobin 9.1, hematocrit 28.2, platelet count 191.   The patient did have a chest x-ray done, which showed no evidence of any acute cardiopulmonary disease.   ASSESSMENT AND PLAN:  This is an 81 year old female with a history of hypertension, gout, hypothyroidism, gastroesophageal reflux disease, who was recently diagnosed with the flu, presents to the hospital with intractable nausea, vomiting, and diarrhea.   PROBLEM LIST: 1.  Intractable nausea, vomiting, and diarrhea. Questionable if this is related to the underlying flu versus another viral gastrointestinal illness. The patient has not improved in the past 48 to 72 hours. Therefore, she will be admitted for supportive care with IV fluids, antiemetics. I will keep her on a clear liquid diet for now. If she continues to have severe diarrhea, I will check stools for comprehensive culture, Clostridium difficile, and ova and parasites.  2.  Influenza. I will continue with her Tamiflu. 3.  Hypertension. I will hold her antihypertensives due to her intractable nausea, vomiting, and diarrhea. I will follow hemodynamics.  4.  Hypothyroidism. Continue Synthroid.  5.  Gastroesophageal reflux disease. Continue Protonix.  6.  Gout. No acute gout attack. Continue allopurinol.   CODE STATUS:  The patient is a FULL CODE.   TIME SPENT:  45 minutes.    ____________________________ Rolly PancakeVivek J. Cherlynn KaiserSainani, MD vjs:ms D: 06/15/2013 18:05:33 ET T: 06/15/2013 18:17:46 ET JOB#: [id number]  cc: Rolly PancakeVivek J. Cherlynn KaiserSainani, MD, <Dictator> Houston SirenVIVEK J Jrake Rodriquez MD ELECTRONICALLY SIGNED 07/05/2013 15:08

## 2014-10-13 NOTE — H&P (Signed)
PATIENT NAME:  Victoria Rodgers, Victoria J MR#:  [id number] DATE OF BIRTH:  1927/09/26  DATE OF ADMISSION:  02/03/2014  PRIMARY CARE PROVIDER: Dr. Burnett ShengHedrick.   EMERGENCY DEPARTMENT REFERRING PHYSICIAN: Dr. Inocencio HomesGayle.   CHIEF COMPLAINT: Nausea, vomiting, diarrhea, and chest pressure.   HISTORY OF PRESENT ILLNESS: The patient is an 81 year old white female with history of hypertension, gout, hypothyroidism, GERD, chronic colitis according to her, who states that she started feeling bad last Wednesday and was diagnosed with laryngitis and was started on some amoxicillin. The patient states that she felt great Thursday and then Friday she started having nausea, vomiting, and diarrhea. She states that she has had 3 bowel movements yesterday, 1 today. She states that it is foul smelling. She does not have any significant abdominal pain. Has also been nauseous and throwing up. She, otherwise, also started having chest pressure since yesterday, which she reports that she has never had before. She also had some shortness of breath yesterday. She, otherwise, denies having any chest pressure with exertion or dyspnea on exertion. She denies any fevers or chills. No cough. No urinary symptoms.   PAST MEDICAL HISTORY: Significant for hypertension and gout, hypothyroidism, GERD and history of chronic colitis.   ALLERGIES: OXYCONTIN, AND SULFA WHICH CAUSES NAUSEA.   SOCIAL HISTORY: Used to be a smoker, quit 40 years ago. Does have a pack-year history of smoking. No alcohol abuse. Lives [facility] a independent living [facility] with her husband.   FAMILY HISTORY: Both mother and father are deceased. Father died from colon cancer. Mother died from suspected ovarian cancer.   CURRENT MEDICATIONS: She is on trazodone 50 mg half to 1 tab at bedtime, tramadol 1 to 2 tabs q. 6 p.r.n., promethazine 25 q. 6 p.r.n. nausea and vomiting, Nexium 40 daily, levothyroxine 88 mcg daily, hydrochlorothiazide /triamterene 25/37.5 mg 1 tab p.o. daily,  estradiol 0.5 daily, Lexapro 10 daily, Benicar 40 daily, aspirin 325 p.o. daily, allopurinol 100 mg 1 tab p.o. daily, albuterol 2 puffs every 4 hours as needed.   REVIEW OF SYSTEMS:  CONSTITUTIONAL: Denies any fevers. Complains of fatigue, weakness. No weight loss. No weight gain.  EYES: No blurred or double vision. No pain. No redness. No inflammation.  EARS, NOSE, AND THROAT: No tinnitus. No ear pain. No hearing loss. No seasonal or year-round allergies. No epistaxis. No nasal discharge. No difficulty swallowing.  RESPIRATORY: Denies any cough, wheezing, hemoptysis. No chronic obstructive pulmonary disease.  CARDIOVASCULAR: Complains of chest pressure. No edema. No arrhythmia. No palpitations.  GASTROINTESTINAL: Complains of nausea, vomiting, diarrhea, but no abdominal pain. No hematemesis. No melena. No IBS. No jaundice. No hemorrhoids.  GENITOURINARY: Denies any dysuria, hematuria, renal calculus or frequency.  ENDOCRINE: Denies any polyuria or nocturia. HEMATOLOGIC AND LYMPHATIC:  Does have a history of anemia and sees Dr. Orlie DakinFinnegan in the oncology clinic. Receive Procrit shots.  MUSCULOSKELETAL: Has a history of gout. NEUROLOGIC: No CVA, TIA or seizures.  PSYCHIATRIC: No anxiety. Has depression.   PHYSICAL EXAMINATION:  VITAL SIGNS: Temperature 98.3, pulse 96, respirations 18, blood pressure 145/95, O2 of 98%.  GENERAL: The patient is a well-developed female in no acute distress.  HEENT: Head atraumatic, normocephalic. Pupils equal, round, and reactive to light and accommodation. There is no conjunctival pallor. No scleral icterus. Nasal exam shows no drainage or ulceration. External ear exam shows no erythema or drainage.  Oropharynx is clear without any exudate.   NECK: Supple without any JVD.  CARDIOVASCULAR: Regular rate and rhythm. No murmurs, rubs,  clicks, or gallops.  LUNGS: Clear to auscultation bilaterally without any rales, rhonchi, wheezing.  ABDOMEN: Soft, nontender,  nondistended. Positive bowel sounds x 4.  EXTREMITIES: No clubbing, cyanosis, or edema.  SKIN: No rash.  LYMPHATICS: No lymph nodes palpable.  VASCULAR: Good DP, PT pulses.  PSYCHIATRIC: Not anxious or depressed.  NEUROLOGIC: Awake, alert, oriented x 3. No focal deficits. Cranial nerves II-XII grossly intact.  EVALUATIONS: EKG showed normal sinus rhythm without any ST-T wave changes. CK-MB 11.5, glucose 129, BUN 11, creatinine 1.51, sodium 130, potassium 3.9, chloride 95, CO2 is 22. WBC 10.1, hemoglobin 11, platelet count 303,000, troponin 4.10. Chest x-ray shows interstitial thickening and small left effusions.   ASSESSMENT AND PLAN: The patient is an 81 year old white female with history of hypertension, gastroesophageal reflux disease, depression, hypothyroidism, and gout who presents with nausea and vomiting, as well as chest pressure.   1.  Non-ST myocardial infarction. In light of her elevated troponin and chest pressure, we will do a heparin drip place, her on aspirin. PRN nitroglycerin. Cardiology consult.   2.  Diarrhea with recent antibiotics, we will rule out Clostridium difficile. Get stool cultures. Stop antibiotics.   3.  Hyponatremia due to dehydration. We will give her IV fluids, hold triamterene/hydrochlorothiazide.   4.  Hypertension. Continue Benicar.   5.  Acute renal failure. Will give her IV fluids.  Her chest x-ray is unclear.   6.  Anemia of chronic disease. Follow her hemoglobin.   7.  Hypothyroidism. We will continue thyroxine. Check a TSH.   8.  MISCELLANEOUS. Deep venous thrombosis prophylaxis. She will be on a heparin drip.   TIME SPENT ON THIS PATIENT: 50 minutes.    ____________________________ Lacie Scotts Allena Katz, MD spat D: 02/03/2014 16:53:42 ET T: 02/03/2014 17:35:45 ET JOB#: [id number]  cc: Yena Tisby H. Allena Katz, MD, <Dictator> Charise Carwin MD ELECTRONICALLY SIGNED 02/06/2014 14:32

## 2014-10-13 NOTE — Consult Note (Signed)
General Aspect 81 year old white female with history of hypertension, gout, hypothyroidism, GERD, chronic colitis with recent URI last week (from husband), started on amoxixillin on Wednesday,  with severe sx on Friday with nausea, vomiting, and diarrhea.presenting with weakness, chest tightness.  Cardiology consulted  for NSTEMI.  She states that she has had 3 bowel movements friday,  foul smelling. Tested positive for C.diff  here. Severe nauseous and throwing up on Friday, with some chest tightness, SOB.   she reports that she has never had chest pain before.  She denies having any chest pressure with exertion or dyspnea on exertion prior to Friday. She denies any fevers or chills. No cough. No urinary symptoms.   "colitis" started one month ago and has persisted on and off, worse on Friday. Tele in last 25 hours, she ahs had 3 to 4 episodes of atrial fibrillation, last one at 4 AM to 5:15 Am with rate 180 bpm, associated with chest tightness and SOB  She does report a hx of renal insufficiency. She has epogen once a mnonth   PAST MEDICAL HISTORY:  Significant for hypertension and gout,  hypothyroidism,  GERD and  history of chronic colitis.   ALLERGIES:  OXYCONTIN, AND SULFA WHICH CAUSES NAUSEA.   SOCIAL HISTORY:  Used to be a smoker, quit 40 years ago. Does have a pack-year history of smoking. No alcohol abuse. Lives at a independent living at [facility] with her husband.   FAMILY HISTORY:  Both mother and father are deceased. Father died from colon cancer.  Mother died from suspected ovarian cancer.   Physical Exam:  GEN well developed, well nourished, no acute distress   HEENT hearing intact to voice, moist oral mucosa   NECK supple   RESP normal resp effort  clear BS   CARD Regular rate and rhythm  No murmur   ABD denies tenderness  soft   LYMPH negative neck   EXTR negative edema   SKIN normal to palpation   NEURO motor/sensory function intact   PSYCH  alert, A+O to time, place, person   Review of Systems:  Subjective/Chief Complaint weak, N/V, vomiting, diarrhea, improving   General: Weakness   Skin: No Complaints   ENT: No Complaints   Eyes: No Complaints   Neck: No Complaints   Respiratory: Short of breath  episodes (with afib?)   Cardiovascular: Chest pain or discomfort  Palpitations  (with afib?)   Gastrointestinal: Nausea  Diarrhea   Genitourinary: No Complaints   Vascular: No Complaints   Musculoskeletal: No Complaints   Neurologic: No Complaints   Hematologic: No Complaints   Endocrine: No Complaints   Psychiatric: No Complaints   Review of Systems: All other systems were reviewed and found to be negative   Medications/Allergies Reviewed Medications/Allergies reviewed     hyponatremia: Mar 2013   Anemia:    Gastric Reflux:    Anxiety:    Hypothyroidism:    Hypertension:    right foot surgery:    right knee replacement:    back surgery times 3:    cervical disc surgery:    Hip Replacement - Right:    Knee Surgery - Left: 6 wks old post op       Admit Diagnosis:   MYOCARDIAL INFARCTION: Onset Date: 04-Feb-2014, Status: Active, Description: MYOCARDIAL INFARCTION  Home Medications: Medication Instructions Status  amoxicillin 875 mg oral tablet 1 tab(s) orally 2 times a day x 7 days.   *start date 01/31/14* Active  albuterol CFC  free 90 mcg/inh inhalation aerosol 2 puff(s) inhaled every 4 hours, As Needed- for Wheezing  Active  Benicar 40 mg oral tablet 1 tab(s) orally once a day Active  hydrochlorothiazide-triamterene 25 mg-37.5 mg oral tablet 1 tab(s) orally once a day Active  estradiol 0.5 mg oral tablet 1 tab(s) orally once a day Active  traZODone 50 mg oral tablet 0.5 to 1 tab(s) orally once a day (at bedtime) Active  traMADol 50 mg oral tablet 1 to 2 tab(s) orally every 6 to 8 hours as needed for pain Active  promethazine 25 mg oral tablet 1 tab(s) orally every 6 hours as  needed for nausea, vomiting. Active  escitalopram 10 mg oral tablet 1 tab(s) orally once a day Active  levothyroxine 88 mcg (0.088 mg) oral tablet 1 tab(s) orally once a day Active  allopurinol 100 mg oral tablet 1 tab(s) orally once a day Active  aspirin 325 mg oral tablet 1 tab(s) orally once a day Active  NexIUM 40 mg oral delayed release capsule 1 cap(s) orally once a day Active   Lab Results:  Routine Chem:  16-Aug-15 08:35   Glucose, Serum  146  BUN 14  Creatinine (comp)  1.66  Sodium, Serum  128  Potassium, Serum 3.7  Chloride, Serum  97  CO2, Serum 22  Calcium (Total), Serum  8.2  Anion Gap 9  Osmolality (calc) 260  eGFR (African American)  32  eGFR (Non-African American)  28 (eGFR values <84mL/min/1.73 m2 may be an indication of chronic kidney disease (CKD). Calculated eGFR is useful in patients with stable renal function. The eGFR calculation will not be reliable in acutely ill patients when serum creatinine is changing rapidly. It is not useful in  patients on dialysis. The eGFR calculation may not be applicable to patients at the low and high extremes of body sizes, pregnant women, and vegetarians.)  Cardiac:  15-Aug-15 12:47   Troponin I  4.10 (0.00-0.05 0.05 ng/mL or less: NEGATIVE  Repeat testing in 3-6 hrs  if clinically indicated. >0.05 ng/mL: POTENTIAL  MYOCARDIAL INJURY. Repeat  testing in 3-6 hrs if  clinically indicated. NOTE: An increase or decrease  of 30% or more on serial  testing suggests a  clinically important change)    16:06   Troponin I  3.50 (0.00-0.05 0.05 ng/mL or less: NEGATIVE  Repeat testing in 3-6 hrs  if clinically indicated. >0.05 ng/mL: POTENTIAL  MYOCARDIAL INJURY. Repeat  testing in 3-6 hrs if  clinically indicated. NOTE: An increase or decrease  of 30% or more on serial  testing suggests a  clinically important change)    21:20   Troponin I  3.20 (0.00-0.05 0.05 ng/mL or less: NEGATIVE  Repeat testing in 3-6 hrs   if clinically indicated. >0.05 ng/mL: POTENTIAL  MYOCARDIAL INJURY. Repeat  testing in 3-6 hrs if  clinically indicated. NOTE: An increase or decrease  of 30% or more on serial  testing suggests a  clinically important change)  Routine Hem:  15-Aug-15 12:47   Hematocrit (CBC)  34.5  16-Aug-15 01:10   Hematocrit (CBC)  32.1   EKG:  Interpretation EKG shows NSR with raet 102 bpm, unable to exclude old anteroseptal MI (poor R wave progression through teh anterior precordial leads)   Radiology Results: XRay:    15-Aug-15 13:37, Chest PA and Lateral  Chest PA and Lateral   REASON FOR EXAM:    N/V, CP, cough  COMMENTS:       PROCEDURE: DXR - DXR  CHEST PA (OR AP) AND LATERAL  - Feb 03 2014  1:37PM     CLINICAL DATA:  Worsening cough.    EXAM:  CHEST  2 VIEW    COMPARISON:  06/15/2013.    FINDINGS:  There are thickened interstitial markings leading to a reticular  nodular type pattern mostly in the lower lungs. Small effusion is  noted on the left. There is mild thickening of the oblique fissures.    Cardiac silhouette is normal in size. No mediastinal or hilar  masses.    Bony thorax is demineralized but intact.     IMPRESSION:  Interstitial thickening and small left effusion. Findings are  consistent with interstitial edema in the proper clinical setting.      Electronically Signed    By: Lajean Manes M.D.    On: 02/03/2014 14:04     Verified By: Lasandra Beech, M.D.,    Sulfa drugs: N/V  Oxycontin: GI Distress  Pain Reliever: Unknown  Vital Signs/Nurse's Notes: **Vital Signs.:   16-Aug-15 12:13  Vital Signs Type Routine  Temperature Temperature (F) 97.7  Celsius 36.5  Temperature Source oral  Pulse Pulse 100  Respirations Respirations 18  Systolic BP Systolic BP 408  Diastolic BP (mmHg) Diastolic BP (mmHg) 80  Mean BP 93  Pulse Ox % Pulse Ox % 89  Pulse Ox Activity Level  At rest  Oxygen Delivery 2L    Impression 81 year old white female with  history of hypertension, gastroesophageal reflux disease, depression, hypothyroidism, and gout who presents with nausea and vomiting,  chest pressure and SOB episodes, c.diff positive, noted to have atrial fib with RVR epsiodes overnight and this AM.  Echo shows EF 20 to 25%   1.  Non-ST myocardial infarction.  Suspect demand ischemia in the setting of profound stress on Friday :diarrhea, N/V, suspected atrial fib/SOB/chest pressure.  --pain free on heparin, though epsiodes of chest pressure and SOB with atrial fib. --would plan for cath later this week on she is less weak, on ABX for c.diff, hydrated and renal function better --Prelim echo showing EF 25%: possible ischemic CM versus tachycardia or stress mediated CM Will start metoprolol q6 for rhythm control (could change over later to coreg) on a heparin drip --continue aspirin, statin, b-blocker  2.  N/V, Diarrhea with recent antibiotics,  Clostridium difficile.  Diarrhea for one month on and off  3) Atrial fibrillation: suspect she has arrhythmia on Friday multiple epsiodes in past 24 hours continue heparin for now Consider changing to other po anticoagulation if she continues to have epsiodes of atrial fib despite medical management. Suspect related to her c.diff, recent URI  4.  Hyponatremia  on IV fluids,  hold triamterene/hydrochlorothiazide.   5.  Hypertension.  Hold Benicar for now so that rhythm control medications can be added,  add back later for cardiomyopathy  6. Acute renal failure.  Likely her baseline? echo with moderate pulmonary HTN, not dehydrated Will hold IVF, RVSP is 60 mm Hg she does report baseline renal dysfunction Possibly from low output failure/cardiorenal syndrome  7.  Anemia of chronic disease.  on epogen as an outpt once a month  8.  Hypothyroidism.  We will continue thyroxine.  TSH 1.32   Electronic Signatures: Ida Rogue (MD)  (Signed 16-Aug-15 17:04)  Authored: General  Aspect/Present Illness, History and Physical Exam, Review of System, Past Medical History, Health Issues, Home Medications, Labs, EKG , Radiology, Allergies, Vital Signs/Nurse's Notes, Impression/Plan   Last Updated: 16-Aug-15 17:04 by  Ida Rogue (MD)

## 2014-10-13 NOTE — Consult Note (Signed)
   Comments   I met with pt's son and daughter. Daughter just arrived last PM from [place]. Updated daughter on pt's condition. Family all seem to be in agreement with not maintaining pt longterm by artificial means. However, daughter and husband are having difficulty accepting that pt might not recover from her acute illness and return to her previous baseline. Daughter asks for time to "digest it all". They are also awaiting arrival of 2nd daughter from [facility]. Husband at home after just being d/c'd from hospital yesterday.  expressed appreciation for meeting and for care pt has received. Support offered.   Electronic Signatures: Sherrick Araki, Izora Gala (MD)  (Signed 20-Aug-15 14:11)  Authored: Palliative Care   Last Updated: 20-Aug-15 14:11 by Tajai Suder, Izora Gala (MD)

## 2014-10-13 NOTE — Consult Note (Signed)
PATIENT NAME:  Victoria, Rodgers MR#:  [id number] DATE OF BIRTH:  March 24, 1928  DATE OF CONSULTATION:  02/06/2014  REFERRING PHYSICIAN:  Dr. Elpidio Anis. CONSULTING PHYSICIAN:  Stann Mainland. Sampson Goon, MD  REASON FOR CONSULTATION: Sepsis, Clostridium difficile, and possible pneumonia.   HISTORY OF PRESENT ILLNESS: This is an 81 year old female admitted August 15, with nausea, vomiting, diarrhea, and chest pressure. Per review of the charts, as no family members are available and the patient is intubated she had been feeling ill 1 week prior and was started on amoxicillin for laryngitis. She then developed, over the next several days, nausea, vomiting and diarrhea. She had also had some foul-smelling diarrhea. She then developed chest pain and shortness of breath. On admission, August 15, she was found to have a non-ST elevation MI with positive troponins and a chest x-ray showing interstitial thickening and small effusions consistent with CHF. She was checked for C. difficile and ended up having a positive C. difficile. Stool. She was started on oral Flagyl and her other antibiotics were held. She has had a complicated course since then including episodes of bradycardia, as well as tachycardia. She was intubated on August 17, following a code. She is currently in the Intensive Care Unit on dopamine and Levo for pressors. An echo has been done and shows an ejection fraction of 20%. She was intubated with 100% FiO2 requirement. She has not had any fevers. She has also been seen by surgery, renal, pulmonary, and palliative care. CT scan of the abdomen showed possible acute cholecystitis as well. Of note, there was no bowel necrosis/perforation. There were bilateral pleural effusions with possible superimposed aspiration or pneumonia. We are consulted for further antibiotic recommendations.   PAST MEDICAL HISTORY:  1.  Hypertension.  2.  Hypothyroidism.  3.  Gout.  4.  GERD.  5.  History of chronic colitis.    PAST SURGICAL HISTORY: None.   SOCIAL HISTORY: Prior smoker, quit 40 years ago. No alcohol abuse. Lives with her husband at [facility].   FAMILY HISTORY: Noncontributory.   ALLERGIES: OXYCONTIN AND SULFA.   REVIEW OF SYSTEMS: Unable to be obtained.   ANTIBIOTICS SINCE ADMISSION: Include meropenem begun today, metronidazole oral begun August 15, and changed to IV today, and oral vancomycin begun on for 17.   PHYSICAL EXAMINATION:  VITAL SIGNS: Temperature 97.4, pulse 80s to 120s, blood pressure 88/63 on pressors, respirations 20, saturations is 100% on 100% FiO2.  GENERAL: She is critically ill appearing, intubated.  HEENT: Pupils are equal sclerae are anicteric. Oropharynx has an ET tube in place.  NECK: Supple.  HEART: Tachy, but regular.  LUNGS: Have decreased breath sounds bilateral bases.  ABDOMEN: Obese, soft, no rebound. No obvious guarding, although she is sedated.  EXTREMITIES: No clubbing, cyanosis or edema.  SKIN: No, rash. There is some bruising.  GENITOURINARY: She has a Foley catheter in place with clear yellow urine.   DATA: CT of her abdomen done on August 17, shows in the lower chest moderate layering bilateral effusions overlying atelectasis and superimposed airspace disease. There is in biliary system high-density gallbladder contents with layered calcific calculi. There are small thickening and pericolic cystic fluid. There is no evidence of bowel perforation or necrosis. There is small volume ascites. There is moderate gaseous distention of the stomach without wall thickening. Chest x-ray on admission was August 15, showed only interstitial thickening and small left effusion. Chest x-ray done this morning shows new right effusion, basilar consolidation, more on the right than  on the left, and consolidation in the right bases increased compared to 1 day prior. White blood count on admission was 10.1. Repeat August 17, at  19.7, currently 9.3, hemoglobin 9.9, platelets  249, 000. Clostridium difficile is positive. Stool cultures are pending. Blood cultures August 17, negative x 2. Echocardiogram shows severe congestive heart failure with an EF of 20%- 25%, severely decreased global LV function, moderate to mild MR, mild to moderate TR. Renal function shows a creatinine of 2.81, albumin is 2.6. Total bilirubin on August 17, was 2.8, AST was 103, ALT was 65, alkaline phosphatase was 64. Troponins were 4.1 on admission, 3.2 on August 15.   IMPRESSION: An 81 year old complicated female admitted with what appears to be Clostridium difficile precipitated by antibiotics for what sounds like upper respiratory infection initially. She then developed a non-ST elevation myocardial infarction. Since then, she has progressed downhill with requirement for intubation with respiratory failure, what appears to be congestive heart failure, acute renal failure, and possible acute cholecystitis.  Of note, her chest x-ray on admission was negative for any infiltrates or pneumonia, but was more consistent with congestive heart failure.   She may have aspirated during her events, so she may also have a new pneumonia on top of severe congestive heart failure and volume overload. She certainly has Clostridium difficile and may also have cholecystitis.   RECOMMENDATIONS:  1.  For antibiotics for the C. difficile, I would recommend both oral vancomycin and IV Flagyl. Her status showing that in Intensive Care Unit  patient's, this is a good regimen, then either one alone.  2.  Further cholecystitis and potential sepsis, as well as possible aspiration pneumonia. Would continue meropenem.  3.  The patient has a poor prognosis.   Thank you for the consult. I will be glad to follow with you.    ____________________________ Stann Mainlandavid P. Sampson GoonFitzgerald, MD dpf:TT D: 02/06/2014 15:47:23 ET T: 02/06/2014 16:21:06 ET JOB#: [id number]  cc: Stann Mainlandavid P. Sampson GoonFitzgerald, MD, <Dictator> DAVID Sampson GoonFITZGERALD  MD ELECTRONICALLY SIGNED 02/09/2014 22:29

## 2014-11-14 IMAGING — CT CT ABD-PELV W/O CM
2 of 4 series · 15 of 46 positions shown, 17 images · non-contrast
Comparison: 02/08/2007

CLINICAL DATA: C difficile with worsening metabolic acidosis.
Evaluate for bowel necrosis.

EXAM:
CT ABDOMEN AND PELVIS WITHOUT CONTRAST
TECHNIQUE: Multidetector CT imaging of the abdomen and pelvis was performed
following the standard protocol without IV contrast.

[Series 2: routine abd pel without · axial · non-contrast · 0.78mm/px · z∈[-527,-87]mm · 12 of 98 slices shown, 14 images]
[im 5/98  soft-tissue]
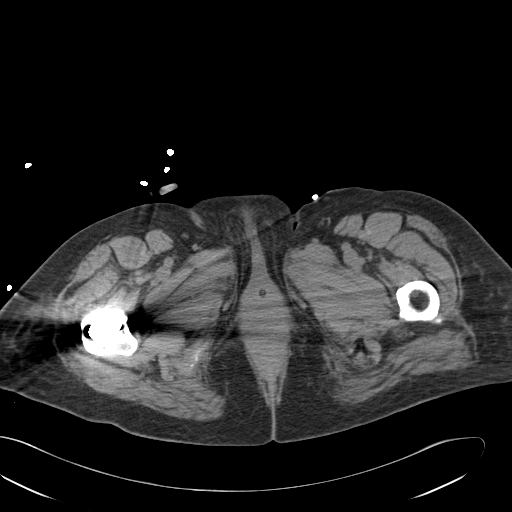
[im 5/98  bone]
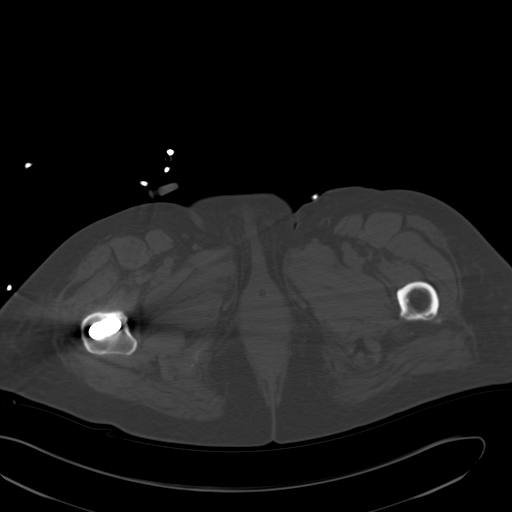
[im 13/98  soft-tissue]
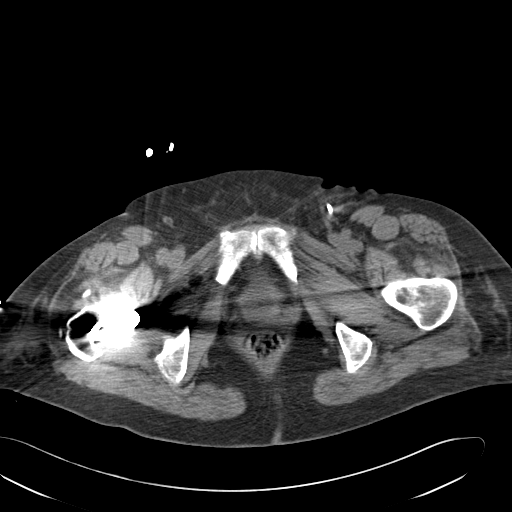
[im 21/98  soft-tissue]
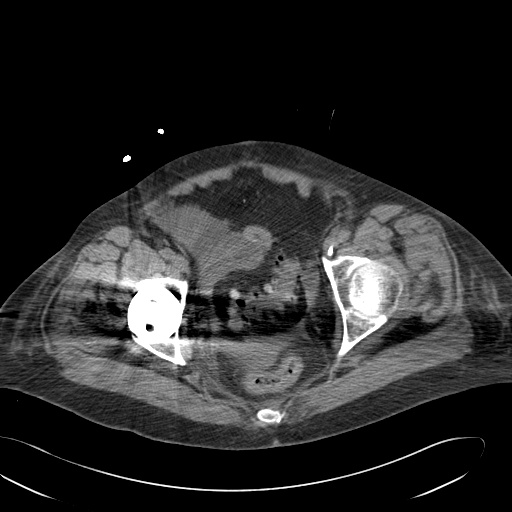
[im 29/98  soft-tissue]
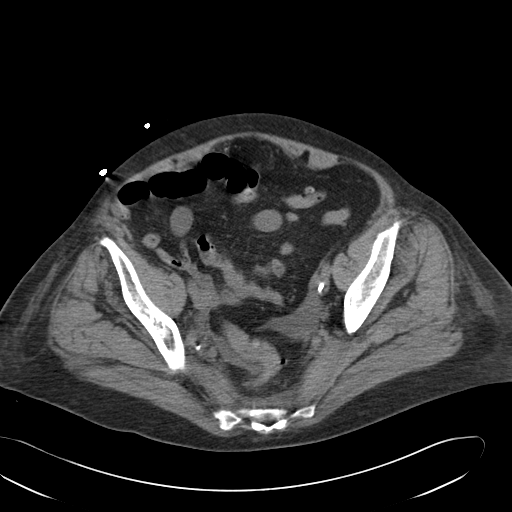
[im 37/98  soft-tissue]
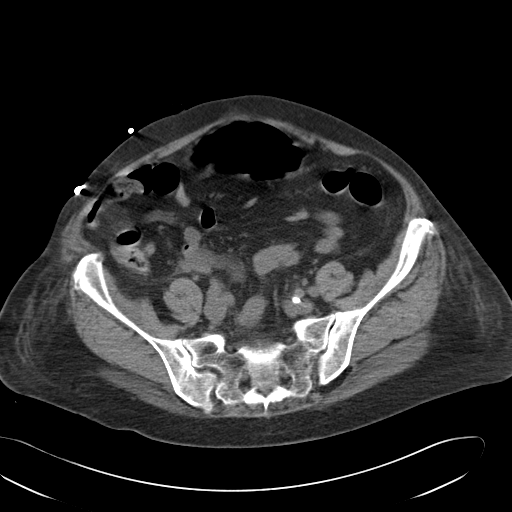
[im 45/98  soft-tissue]
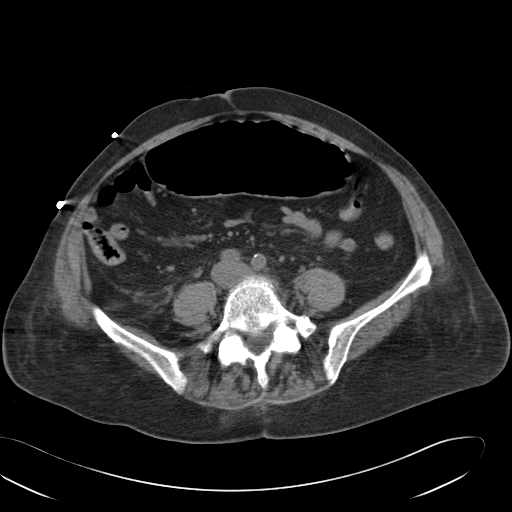
[im 53/98  soft-tissue]
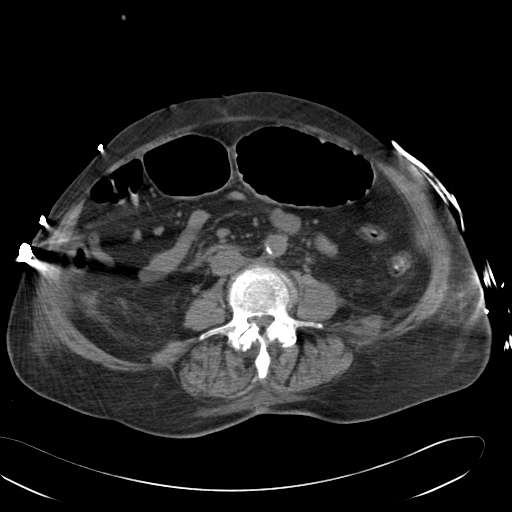
[im 61/98  soft-tissue]
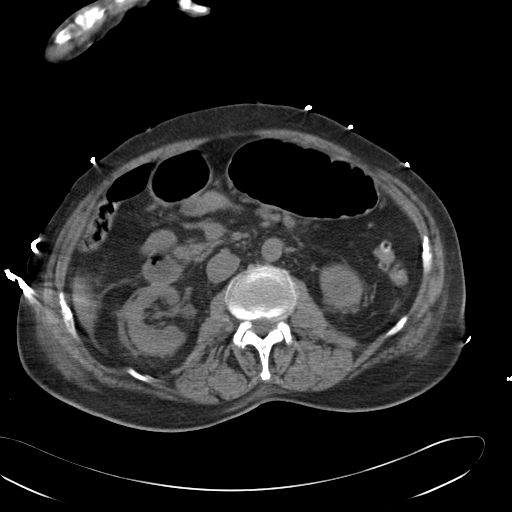
[im 69/98  soft-tissue]
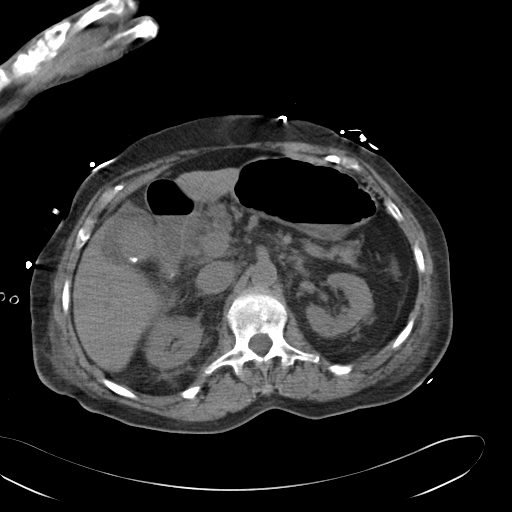
[im 69/98  bone]
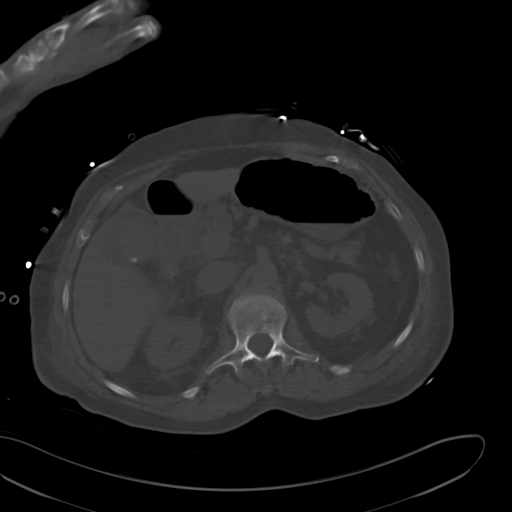
[im 77/98  soft-tissue]
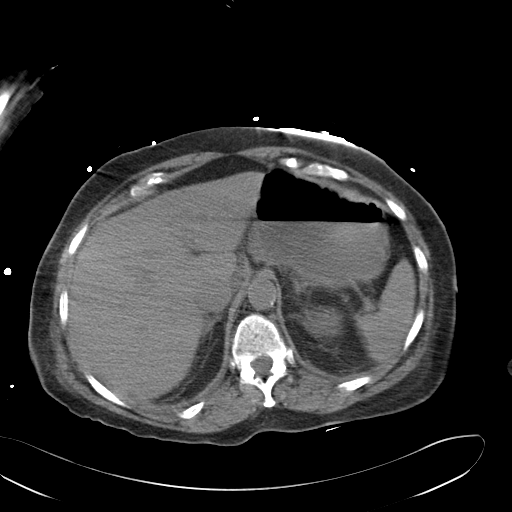
[im 85/98  soft-tissue]
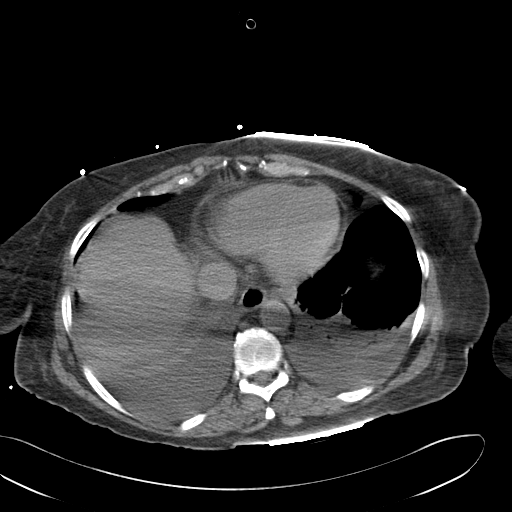
[im 93/98  soft-tissue]
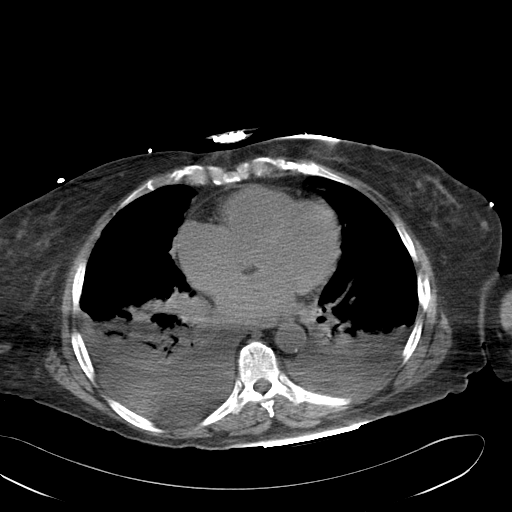

[Series 5: cor routine abd pel wo · coronal · 0.74mm/px · 3 of 130 slices shown]
[im 44/130  soft-tissue]
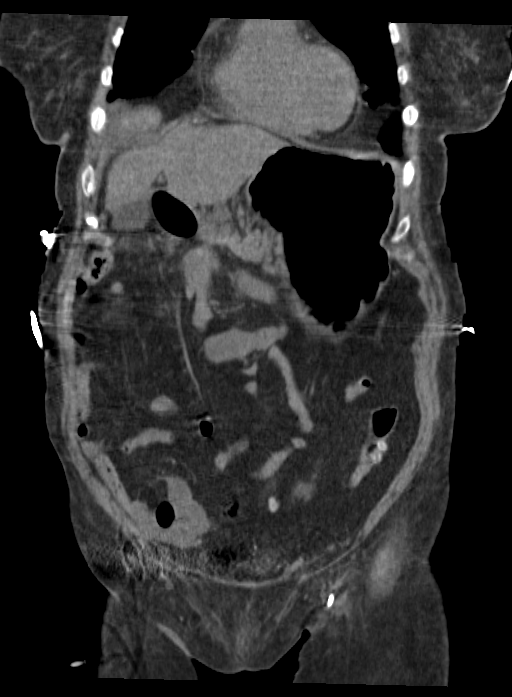
[im 58/130  soft-tissue]
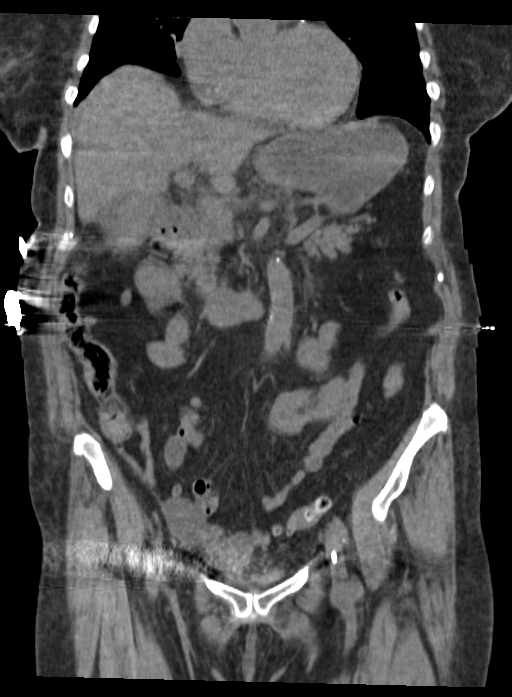
[im 72/130  soft-tissue]
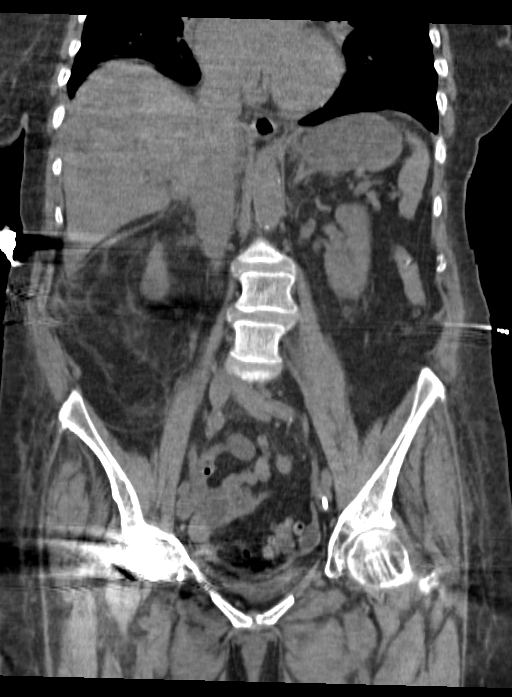

[15 of 46 positions shown; findings below may reference images not displayed]

FINDINGS: BODY WALL: Left femoral catheter with tip at the left common iliac
vein.

LOWER CHEST: Moderate layering bilateral pleural effusions with
overlying atelectasis. There is superimposed airspace disease which
is dense in the lower lobes and micronodular in the lingula and
right middle lobe. Coronary atherosclerosis.

ABDOMEN/PELVIS:

Liver: No focal abnormality.

Biliary: High-density gallbladder contents with layering calcified
calculi. There is wall thickening or pericholecystic edema. There is
retroperitoneal fluid in the right upper quadrant, although it is
symmetric to the contralateral abdomen.

Pancreas: Retroperitoneal edema but no gland expansion or edema to
suggest pancreatitis.

Spleen: Unremarkable.

Adrenals: Unremarkable.

Kidneys and ureters: No hydronephrosis or stone.

Bladder: Limited evaluation due to right hip prosthesis. Bladder is
decompressed by Foley catheter.

Reproductive: Hysterectomy. 16 mm cyst in the left ovary which is
stable from 5776.

Bowel: No colonic wall thickening to suggest colitis or ischemia.
Negative appendix. There is moderate gaseous distension of the
stomach without wall thickening. Negative small bowel. No
pneumatosis.

Retroperitoneum: No mass or adenopathy.

Peritoneum: Small volume ascites in the low pelvis. No evidence of
abscess. No pneumoperitoneum.

Vascular: No acute abnormality.

OSSEOUS: Advanced lower lumbar facet osteoarthritis and degenerative
disc disease. No evidence of infection.
IMPRESSION: 1. No evidence of bowel necrosis or perforation.
2. Moderate bilateral pleural effusion with superimposed aspiration
or pneumonia.
3. Cholelithiasis and gallbladder wall thickening. Acute
cholecystitis could have this appearance, correlate with right upper
quadrant exam.
4. Gas dilated stomach.

## 2014-11-14 IMAGING — CR DG CHEST 1V PORT
1 series · 1 of 1 positions shown · non-contrast
Comparison: Portable exam 9598 hr compared 02/03/2014

CLINICAL DATA: Worsening hypoxia, MI

EXAM:
PORTABLE CHEST - 1 VIEW

[ap]
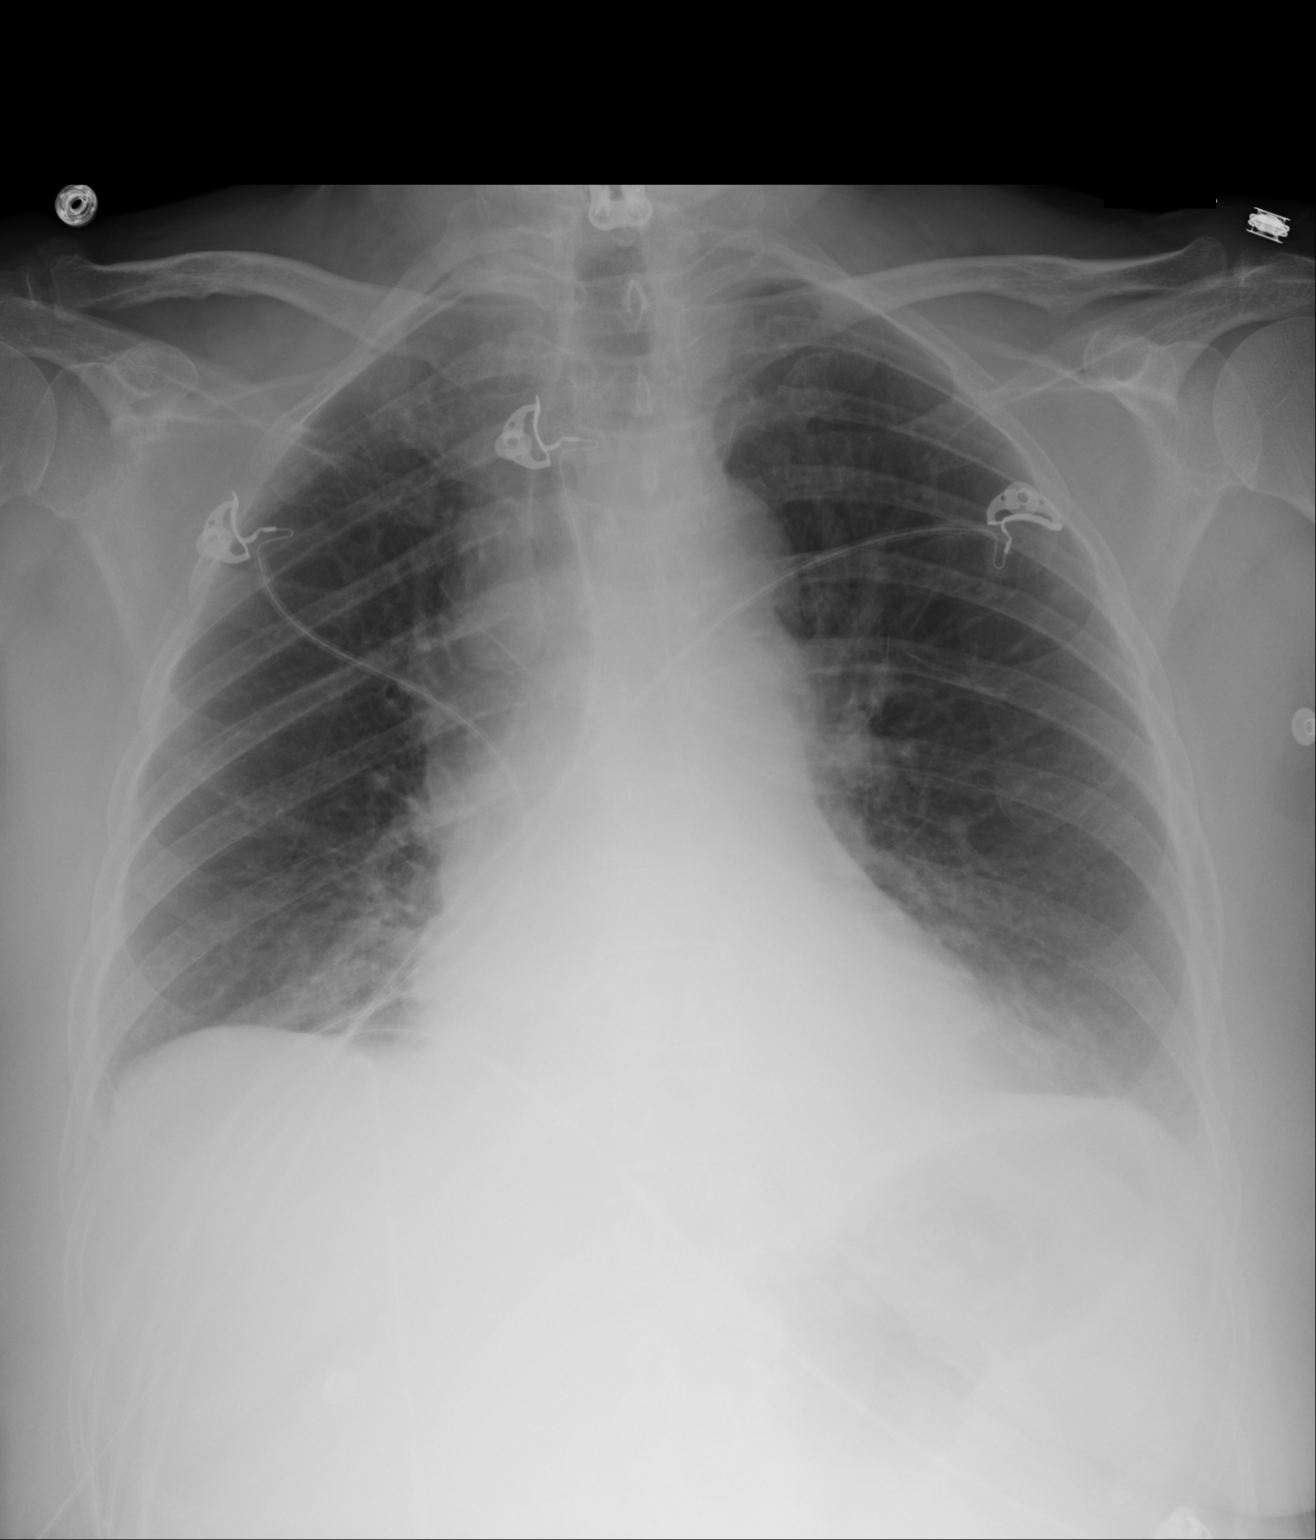

[1 of 1 positions shown; findings below may reference images not displayed]

FINDINGS: Borderline enlargement of cardiac silhouette.

Mediastinal contours normal.

Slight pulmonary vascular congestion.

Improved interstitial edema since previous study.

Minimal basilar atelectasis.

No segmental consolidation, pleural effusion or pneumothorax.

Prior cervical spine fusion.
IMPRESSION: Improved pulmonary edema since previous exam.

## 2014-11-19 IMAGING — CR DG CHEST 1V PORT
1 series · 2 of 2 positions shown · non-contrast
Comparison: Portable chest x-ray February 06, 2014.

CLINICAL DATA: CHF and shortness of breath.

EXAM:
PORTABLE CHEST - 1 VIEW

[Series 1: ap · 0.17mm/px · 2 of 2 slices shown]
[im 1/2]
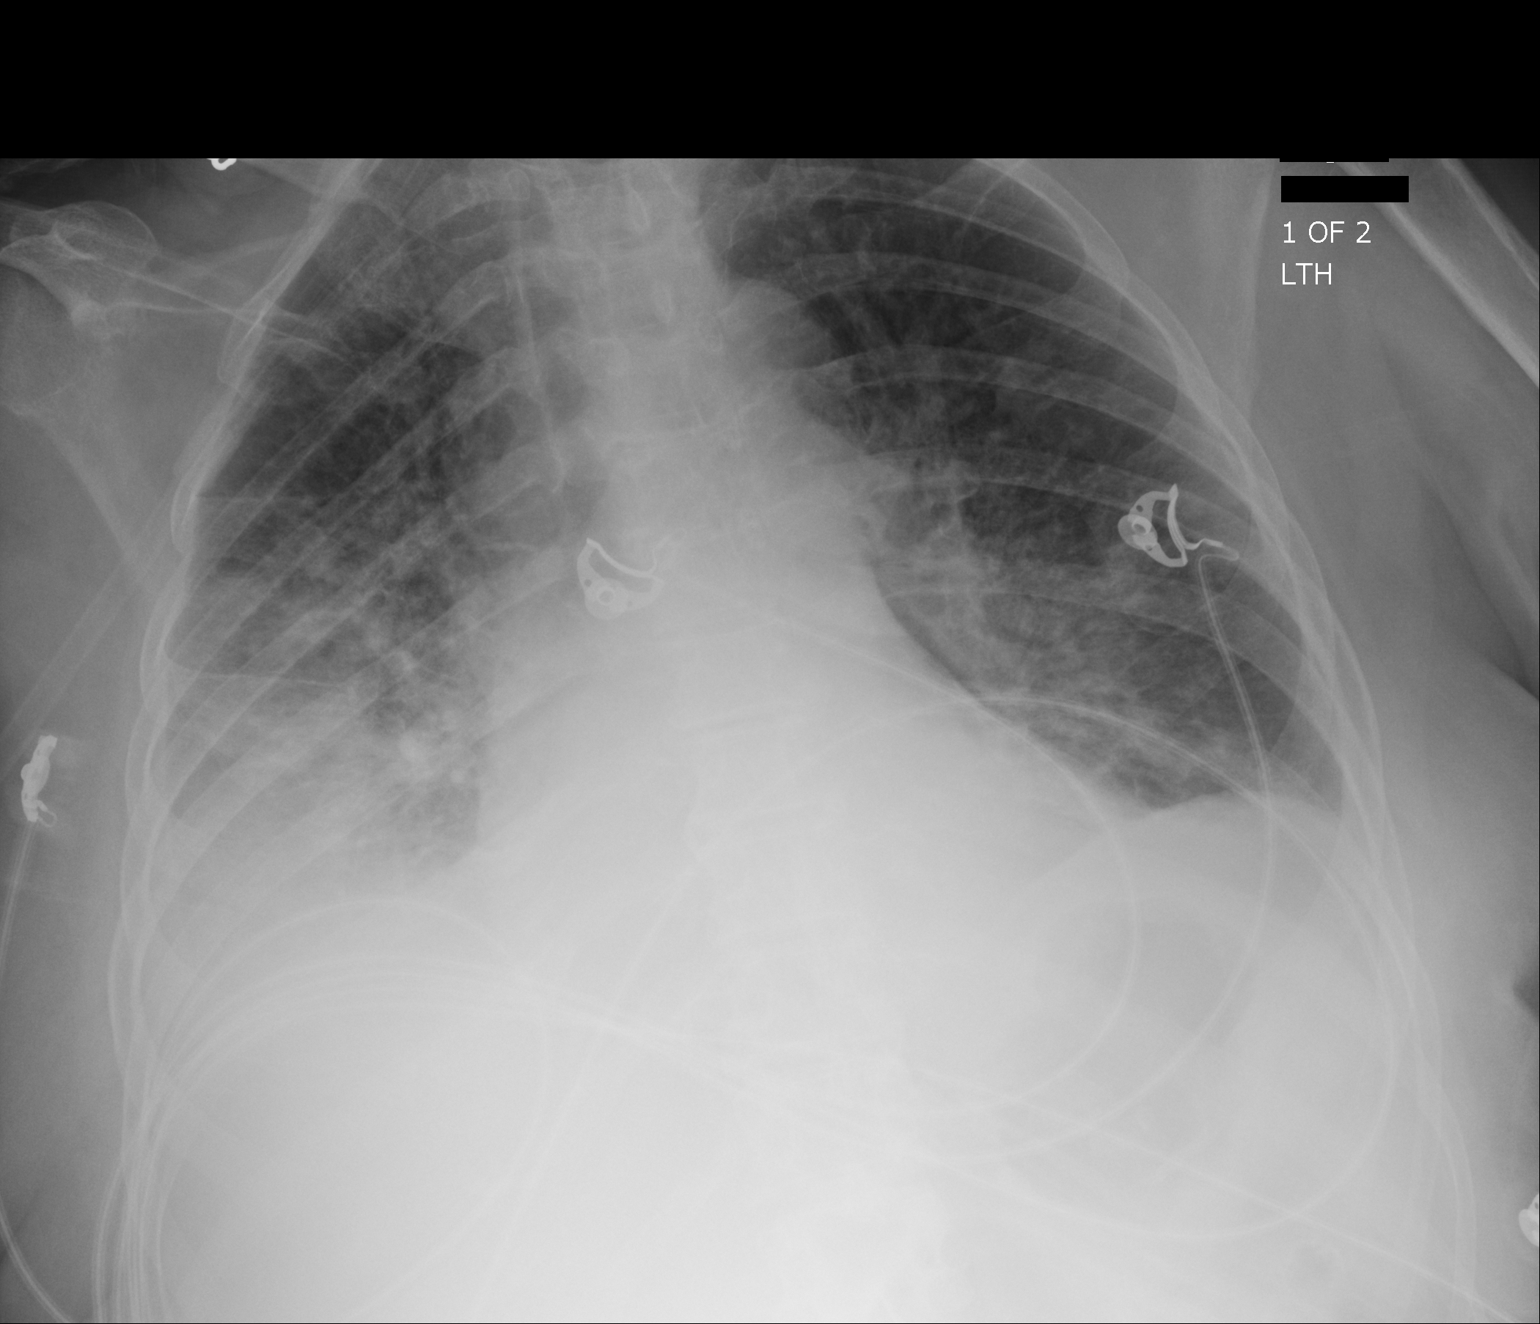
[im 2/2]
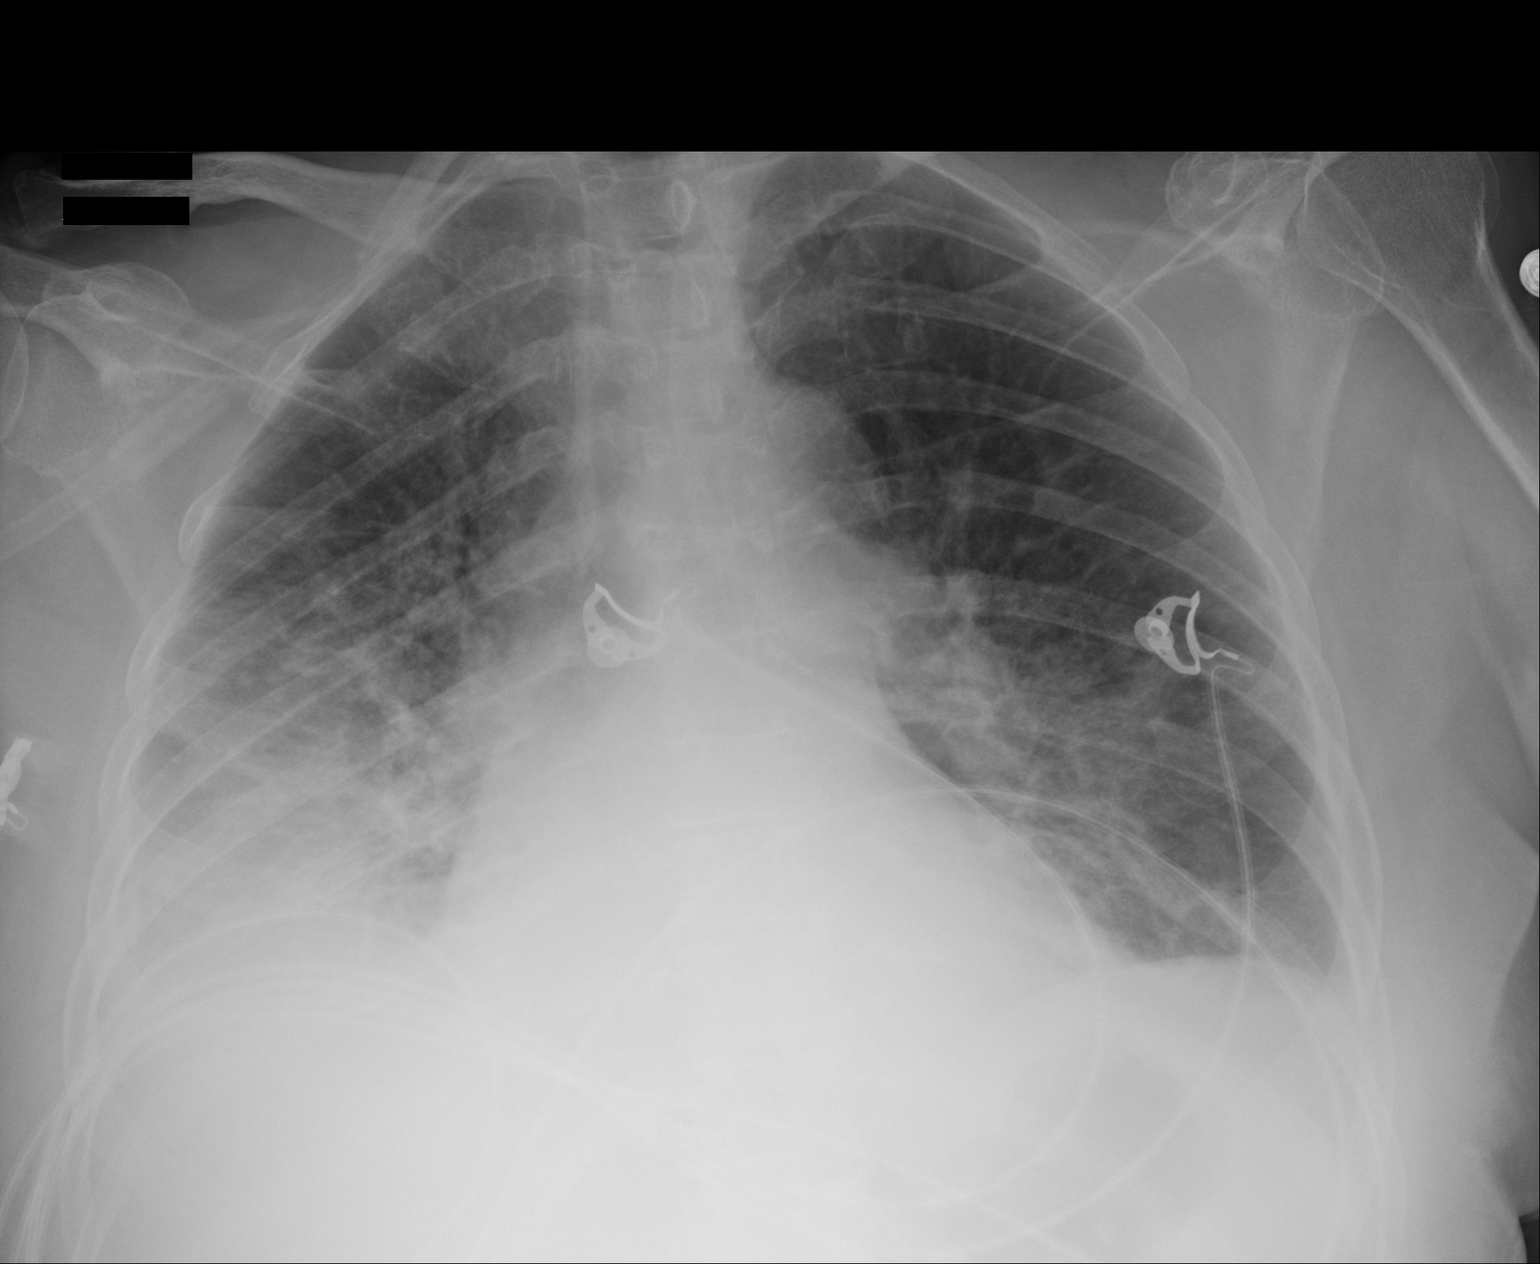

[2 of 2 positions shown; findings below may reference images not displayed]

FINDINGS: The patient has undergone interval extubation of the trachea. The
lungs are adequately inflated. There are confluent interstitial lung
markings at both lung bases and in the mid lung zones most
conspicuous on the right. There is likely a right pleural effusion.
The cardiac silhouette is mildly enlarged. The pulmonary vascularity
is indistinct.
IMPRESSION: Since the previous study there has been slight deterioration of the
appearance of the pulmonary interstitium with slightly increased
interstitial edema especially on the right and confluent density at
the right lung base which may reflect increased pleural fluid.
Overall the findings are consistent with slight worsening of CHF.
